# Patient Record
Sex: Female | Born: 1955 | Race: White | Hispanic: No | Marital: Married | Smoking: Former smoker
Health system: Southern US, Community
[De-identification: ages and names within clinical notes are randomized; demographics above are authoritative.]

## PROBLEM LIST (undated history)

## (undated) DIAGNOSIS — M1990 Unspecified osteoarthritis, unspecified site: Secondary | ICD-10-CM

## (undated) DIAGNOSIS — K219 Gastro-esophageal reflux disease without esophagitis: Secondary | ICD-10-CM

## (undated) DIAGNOSIS — R112 Nausea with vomiting, unspecified: Secondary | ICD-10-CM

## (undated) HISTORY — PX: REPLACEMENT TOTAL HIP W/  RESURFACING IMPLANTS: SUR1222

---

## 1998-04-27 ENCOUNTER — Encounter: Payer: Self-pay | Admitting: Surgery

## 1998-04-27 ENCOUNTER — Ambulatory Visit (HOSPITAL_COMMUNITY): Admission: RE | Admit: 1998-04-27 | Discharge: 1998-04-27 | Payer: Self-pay | Admitting: Surgery

## 1998-11-26 ENCOUNTER — Ambulatory Visit (HOSPITAL_COMMUNITY): Admission: RE | Admit: 1998-11-26 | Discharge: 1998-11-26 | Payer: Self-pay | Admitting: Family Medicine

## 1998-11-26 ENCOUNTER — Encounter: Payer: Self-pay | Admitting: Family Medicine

## 1999-09-28 ENCOUNTER — Encounter: Payer: Self-pay | Admitting: Family Medicine

## 1999-09-28 ENCOUNTER — Encounter: Admission: RE | Admit: 1999-09-28 | Discharge: 1999-09-28 | Payer: Self-pay | Admitting: Family Medicine

## 2000-11-01 ENCOUNTER — Ambulatory Visit (HOSPITAL_COMMUNITY): Admission: RE | Admit: 2000-11-01 | Discharge: 2000-11-01 | Payer: Self-pay | Admitting: Family Medicine

## 2000-11-01 ENCOUNTER — Encounter: Payer: Self-pay | Admitting: Family Medicine

## 2001-12-17 ENCOUNTER — Ambulatory Visit (HOSPITAL_COMMUNITY): Admission: RE | Admit: 2001-12-17 | Discharge: 2001-12-17 | Payer: Self-pay | Admitting: Family Medicine

## 2001-12-17 ENCOUNTER — Encounter: Payer: Self-pay | Admitting: Family Medicine

## 2005-01-25 ENCOUNTER — Encounter: Admission: RE | Admit: 2005-01-25 | Discharge: 2005-01-25 | Payer: Self-pay | Admitting: Family Medicine

## 2006-04-11 ENCOUNTER — Encounter: Admission: RE | Admit: 2006-04-11 | Discharge: 2006-04-11 | Payer: Self-pay | Admitting: Family Medicine

## 2007-08-26 ENCOUNTER — Encounter: Admission: RE | Admit: 2007-08-26 | Discharge: 2007-08-26 | Payer: Self-pay | Admitting: Family Medicine

## 2008-04-15 ENCOUNTER — Encounter: Admission: RE | Admit: 2008-04-15 | Discharge: 2008-04-15 | Payer: Self-pay | Admitting: Sports Medicine

## 2008-04-28 ENCOUNTER — Encounter: Admission: RE | Admit: 2008-04-28 | Discharge: 2008-04-28 | Payer: Self-pay | Admitting: Sports Medicine

## 2008-07-09 ENCOUNTER — Encounter: Admission: RE | Admit: 2008-07-09 | Discharge: 2008-07-09 | Payer: Self-pay | Admitting: Orthopedic Surgery

## 2008-09-29 ENCOUNTER — Inpatient Hospital Stay (HOSPITAL_COMMUNITY): Admission: RE | Admit: 2008-09-29 | Discharge: 2008-10-01 | Payer: Self-pay | Admitting: Orthopedic Surgery

## 2008-12-14 ENCOUNTER — Inpatient Hospital Stay (HOSPITAL_COMMUNITY): Admission: RE | Admit: 2008-12-14 | Discharge: 2008-12-16 | Payer: Self-pay | Admitting: Orthopedic Surgery

## 2010-01-18 ENCOUNTER — Encounter
Admission: RE | Admit: 2010-01-18 | Discharge: 2010-01-18 | Payer: Self-pay | Source: Home / Self Care | Attending: Family Medicine | Admitting: Family Medicine

## 2010-05-04 LAB — BASIC METABOLIC PANEL
CO2: 27 mEq/L (ref 19–32)
CO2: 28 mEq/L (ref 19–32)
Calcium: 8.1 mg/dL — ABNORMAL LOW (ref 8.4–10.5)
Calcium: 9.2 mg/dL (ref 8.4–10.5)
Chloride: 105 mEq/L (ref 96–112)
Chloride: 107 mEq/L (ref 96–112)
Creatinine, Ser: 0.62 mg/dL (ref 0.4–1.2)
Creatinine, Ser: 0.65 mg/dL (ref 0.4–1.2)
GFR calc Af Amer: >60 mL/min (ref >60)
GFR calc non Af Amer: >60 mL/min (ref >60)
Glucose, Bld: 100 mg/dL — ABNORMAL HIGH (ref 70–99)
Glucose, Bld: 103 mg/dL — ABNORMAL HIGH (ref 70–99)
Sodium: 137 mEq/L (ref 135–145)
Sodium: 139 mEq/L (ref 135–145)
Sodium: 138 mEq/L (ref 135–145)

## 2010-05-04 LAB — CBC
Hemoglobin: 10.3 g/dL — ABNORMAL LOW (ref 12.0–15.0)
Hemoglobin: 9.8 g/dL — ABNORMAL LOW (ref 12.0–15.0)
Hemoglobin: 13.7 g/dL (ref 12.0–15.0)
MCHC: 33.5 g/dL (ref 30.0–36.0)
MCHC: 33.6 g/dL (ref 30.0–36.0)
MCHC: 34.3 g/dL (ref 30.0–36.0)
MCV: 85.1 fL (ref 78.0–100.0)
MCV: 85.8 fL (ref 78.0–100.0)
Platelets: 264 10*3/uL (ref 150–400)
RBC: 3.59 MIL/uL — ABNORMAL LOW (ref 3.87–5.11)
RDW: 13.3 % (ref 11.5–15.5)
RDW: 13.6 % (ref 11.5–15.5)

## 2010-05-04 LAB — DIFFERENTIAL
Basophils Absolute: 0 10*3/uL (ref 0.0–0.1)
Basophils Relative: 0 % (ref 0–1)
Lymphocytes Relative: 26 % (ref 12–46)
Monocytes Absolute: 0.3 10*3/uL (ref 0.1–1.0)
Neutro Abs: 3.4 10*3/uL (ref 1.7–7.7)
Neutrophils Relative %: 65 % (ref 43–77)

## 2010-05-04 LAB — URINALYSIS, ROUTINE W REFLEX MICROSCOPIC
Bilirubin Urine: NEGATIVE
Ketones, ur: NEGATIVE mg/dL
Nitrite: NEGATIVE
Protein, ur: NEGATIVE mg/dL

## 2010-05-04 LAB — TYPE AND SCREEN
ABO/RH(D): B POS
Antibody Screen: NEGATIVE

## 2010-05-04 LAB — PROTIME-INR: INR: 0.99 (ref 0.00–1.49)

## 2010-05-06 LAB — CBC
HCT: 32 % — ABNORMAL LOW (ref 36.0–46.0)
HCT: 33.3 % — ABNORMAL LOW (ref 36.0–46.0)
Hemoglobin: 11.4 g/dL — ABNORMAL LOW (ref 12.0–15.0)
MCV: 87.2 fL (ref 78.0–100.0)
MCV: 87.3 fL (ref 78.0–100.0)
Platelets: 234 10*3/uL (ref 150–400)
Platelets: 236 10*3/uL (ref 150–400)
RDW: 13.4 % (ref 11.5–15.5)
RDW: 13.6 % (ref 11.5–15.5)
WBC: 6.5 10*3/uL (ref 4.0–10.5)

## 2010-05-06 LAB — BASIC METABOLIC PANEL
BUN: 3 mg/dL — ABNORMAL LOW (ref 6–23)
BUN: 3 mg/dL — ABNORMAL LOW (ref 6–23)
Chloride: 102 mEq/L (ref 96–112)
Chloride: 104 mEq/L (ref 96–112)
Creatinine, Ser: 0.64 mg/dL (ref 0.4–1.2)
GFR calc non Af Amer: >60 mL/min (ref >60)
Glucose, Bld: 104 mg/dL — ABNORMAL HIGH (ref 70–99)
Glucose, Bld: 123 mg/dL — ABNORMAL HIGH (ref 70–99)
Potassium: 3.7 mEq/L (ref 3.5–5.1)
Potassium: 4.1 mEq/L (ref 3.5–5.1)
Sodium: 134 mEq/L — ABNORMAL LOW (ref 135–145)

## 2010-05-07 LAB — URINALYSIS, ROUTINE W REFLEX MICROSCOPIC
Bilirubin Urine: NEGATIVE
Glucose, UA: NEGATIVE mg/dL
Hgb urine dipstick: NEGATIVE
Ketones, ur: NEGATIVE mg/dL
Protein, ur: NEGATIVE mg/dL
Urobilinogen, UA: 0.2 mg/dL (ref 0.0–1.0)

## 2010-05-07 LAB — ABO/RH: ABO/RH(D): B POS

## 2010-05-07 LAB — BASIC METABOLIC PANEL
BUN: 6 mg/dL (ref 6–23)
CO2: 28 mEq/L (ref 19–32)
Calcium: 9.1 mg/dL (ref 8.4–10.5)
Chloride: 102 mEq/L (ref 96–112)
Creatinine, Ser: 0.57 mg/dL (ref 0.4–1.2)
Glucose, Bld: 86 mg/dL (ref 70–99)

## 2010-05-07 LAB — PREGNANCY, URINE: Preg Test, Ur: NEGATIVE

## 2010-05-07 LAB — CBC
MCHC: 34.2 g/dL (ref 30.0–36.0)
MCV: 86.8 fL (ref 78.0–100.0)
RDW: 13.5 % (ref 11.5–15.5)

## 2010-05-07 LAB — TYPE AND SCREEN

## 2010-05-07 LAB — APTT: aPTT: 24 seconds (ref 24–37)

## 2010-05-07 LAB — DIFFERENTIAL
Basophils Absolute: 0 10*3/uL (ref 0.0–0.1)
Basophils Relative: 1 % (ref 0–1)
Eosinophils Absolute: 0.1 10*3/uL (ref 0.0–0.7)
Monocytes Relative: 6 % (ref 3–12)
Neutro Abs: 3.8 10*3/uL (ref 1.7–7.7)
Neutrophils Relative %: 71 % (ref 43–77)

## 2010-05-07 LAB — PROTIME-INR: INR: 1 (ref 0.00–1.49)

## 2010-06-14 NOTE — Discharge Summary (Signed)
Isabel Salazar, Isabel Salazar                ACCOUNT NO.:  [id number]   MEDICAL RECORD NO.:  [id number]          PATIENT TYPE:  INP   LOCATION:  1615                         FACILITY:  [facility]   PHYSICIAN:  Madlyn Frankel. Charlann Boxer, M.D.  DATE OF BIRTH:  04-14-1955   DATE OF ADMISSION:  09/29/2008  DATE OF DISCHARGE:  10/01/2008                               DISCHARGE SUMMARY   ADMITTING DIAGNOSIS:  1. Avascular necrosis.  2. Osteoarthritis.   DISCHARGE DIAGNOSIS:  1. Avascular necrosis.  2. Osteoarthritis.   HISTORY OF PRESENT ILLNESS:  A 55 year old female with a history of left  hip pain secondary to avascular necrosis.  It was refractory to all  conservative treatment.   CONSULTANT:  None.   PROCEDURE:  Left total hip replacement by surgeon Dr. Durene Romans,  assistant Dwyane Luo PA-C.  Components included Pinnacle metal insert 36  with a Pinnacle sector cup, size #5 Trilock, and metal 36+ 1.5 head.   LABORATORY DATA:  CBC final readings, white blood cells 6.1, hematocrit  32.  Metabolic:  Sodium 139, potassium 4.1, BUN 3, creatinine 0.64,  glucose 104.   HOSPITAL COURSE:  The patient was admitted to the hospital, underwent a  left total hip replacement, admitted to the orthopedic floor.  Her stay  was unremarkable.  She remained hemodynamically and orthopedically  stable throughout the course stay.  She remained afebrile.  Day #2  dressing check, no significant drainage from wound.  She is  neurovascularly intact to this left lower extremity.  She was  weightbearing as tolerated.  Making good progress.  She was stable and  ready for discharge home.   DISCHARGE DISPOSITION:  Discharged home in stable improved condition.   DISCHARGE DIET:  Heart-healthy.   DISCHARGE WOUND CARE:  Keep dry.   DISCHARGE MEDICATIONS:  1. Aspirin 325 mg p.o. b.i.d. times 6 weeks  2. Robaxin 500 mg one p.o. q. six p.r.n. muscle spasm pain.  3. Iron 325 mg one p.o. t.i.d. x2 weeks.  4. Colace 100 mg p.o.  b.i.d.  5. MiraLax 17 grams p.o. daily p.r.n.  6. Norco 7.5/325 one to two p.o. q. 4 to 6 hours p.r.n. pain.   DISCHARGE FOLLOWUP:  1. Follow with Dr. Charlann Boxer at phone number [phone] in two weeks for      wound check.  2. Physical therapy.  She is weightbearing as tolerated with use of      rolling walker.     ______________________________  Yetta Glassman [address], [place]      Madlyn Frankel. Charlann Boxer, M.D.  Electronically Signed    BLM/MEDQ  D:  10/01/2008  T:  10/01/2008  Job:  [id number]   cc:   Dorisann Frames  Fax: [phone]

## 2010-06-14 NOTE — Op Note (Signed)
Isabel Salazar, Isabel Salazar                ACCOUNT NO.:  [id number]   MEDICAL RECORD NO.:  [id number]          PATIENT TYPE:  INP   LOCATION:  0004                         FACILITY:  [facility]   PHYSICIAN:  Madlyn Frankel. Charlann Boxer, M.D.  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   PROCEDURE:  Left total hip replacement.  Components used:  DePuy hip  system, size 50 pinnacle cup, 36 metal liner, a 5 high Tri-Lock steam  with a 36, 1.5 aSphere ball.   SURGEON:  Madlyn Frankel. Charlann Boxer, M.D.   ASSISTANT:  Dwyane Luo, PA-C   ANESTHESIA:  General.   SPECIMENS AND FINDINGS:  None.   COMPLICATIONS:  None.   BLOOD LOSS:  400 mL.   DRAINS:  One Hemovac.   INDICATION FOR PROCEDURE:  Ms. Helin is a 55 year old female who  presented for a second opinion evaluation of some lower extremity pain.  She had her back and knees worked up prior; however, complaints were  more significant around the hip joint.  Radiographs revealed that she  had end-stage bilateral hip osteoarthritis secondary to an underlying  developmental dysplasia of her hip.  She had a Crowe type 1 with  containment of the femoral head but significant lateralization within  the acetabulum and advanced bone-on-bone arthritis.  At this point there  were no conservative measures that would provide relief and despite  being 53, the only option of pain relief that was predictable enough  would be arthroplasty.  The risks of infection, DVT, component failure,  need for revision as well as discussion of bearing surfaces include  prior to her consenting for total hip replacement for pain relief.   PROCEDURE IN DETAIL:  The patient was brought to operative theater.  Once adequate  anesthesia, preoperative antibiotics, Ancef, was  administered, the patient was positioned in the right lateral decubitus  position with the  left side up.  The left lower extremity was  prescrubbed, prepped and draped in sterile fashion.  A time-out was  performed, identifying the patient, the planned procedure and the  correct extremity.   A lateral-based incision was made proximal to the trochanter.  Sharp  dissection was carried down to the iliotibial band and gluteal fascia.  The short external rotators were taken down separate from the posterior  capsule.  An L capsulotomy was made, preserving the posterior capsule.  The hip was dislocated and a neck osteotomy was made based off anatomic  landmarks.  Preoperative templating utilizing the high-offset neck and  head sizes and based on the center of the head.  Femoral canal was then  opened with a box osteotome, then drilled and hand-reamed once, followed  by irrigation to prevent fat emboli, beginning to broach and setting  anteversion to her native anteversion, which was probably about 20  degrees plus.  I broached up to a size 4, used a calcar planer to finish  off the cut.  I then packed off the femur was went to the acetabulum.  When acetabular was obtained, the labrum was debrided and we began  reaming with a 44, reamed up to a 51 reamer with good bony bed  preparation.  I used a 5/8 curved osteotome to remove posterior and  anterior inferior osteophytes to further define the anatomy of the  pelvis.  The final 52 pinnacle cup was then impacted.  It sat at  approximately 35-40 degrees of abduction and was in anatomic position  within the pelvis with a portion of the cup exposed superolaterally and  then into the anterior rim anteriorly.  With the cup positioned, there  appeared to be about 35-40 degrees of abduction.  Two cancellous screws  were placed and the ilium with support and purchase.   Based on the cup position and my  broaching, I went ahead and placed a  final liner.  A hole eliminator was placed and a 36 metal liner was then  impacted into place.   At this point the trial reduction ensued with a 4 four broach and a high-  offset neck.  With this,hip, combined anteversion was 45-50 degrees and  the hip was very stable without evidence of impingement or subluxation  with forward flexion, internal rotation or external rotation in  extension.   There was a little bit of shuck in extension.  Given this and the fact  that her contralateral hip was just as bad, I chose to go ahead and  broach up to a size 5, so I went ahead and removed the trials, broached  up to a 5, which sat about 2 mm proud of my neck cut.  The final 5 high  Tri-Lock stem was then impacted, sat about 2-3 mm higher than the neck  cut.  I retrialed and was happy with a 36, 1.5 ball in terms of range of  motion and stability.  The final 36, +1.5 aSphere ball was then impacted  on a clean and dry trunnion.  The hip was reduced.  The hip was  irrigated throughout the case and again at this point.  I reapproximated  the posterior capsule and the superior leaflet using #1 Vicryl.  The  medium Hemovac drain was placed deep.  The iliotibial band and gluteal  fascia were then reapproximated using #1 Vicryl, 2-0 Vicryl the in subcu  layer and a running 4-0 Monocryl in the skin.  Skin was cleaned, dried  and dressed sterilely with Steri-Strips and a Mepilex dressing.  She was  then brought to the recovery room and extubated in stable condition,  tolerating the procedure well.      Madlyn Frankel Charlann Boxer, M.D.  Electronically Signed     MDO/MEDQ  D:  09/29/2008  T:  09/29/2008  Job:  [id number]

## 2010-06-17 NOTE — H&P (Signed)
NAMESARAHBETH, CASHIN                ACCOUNT NO.:  [id number]   MEDICAL RECORD NO.:  [id number]          PATIENT TYPE:  INP   LOCATION:  NA                           FACILITY:  [facility]   PHYSICIAN:  Madlyn Frankel. Charlann Boxer, M.D.  DATE OF BIRTH:  Jun 01, 1955   DATE OF ADMISSION:  09/29/2008  DATE OF DISCHARGE:                              HISTORY & PHYSICAL   PROCEDURE:  Will be a left total hip placement.   CHIEF COMPLAINT:  Left hip pain.   HISTORY OF PRESENT ILLNESS:  A 55 year old female with a history of left  hip pain secondary to avascular necrosis.  It has been refractory to all  conservative treatment.  She has also is mildly symptomatic on the right  side as well.   PRIMARY CARE PHYSICIAN:  Dr. Creta Levin.   PAST MEDICAL HISTORY:  1. Avascular necrosis.  2. Osteoarthritis.   PAST SURGERIES:  Microfracture left knee April 2009.   FAMILY HISTORY:  Cancer, lymphoma osteoarthritis, COPD.   SOCIAL HISTORY:  Married.  She is a Lawyer.  Nonsmoker.  Primary caregiver  will be husband home postoperatively.   DRUG ALLERGIES:  No known drug allergies.   MEDICATIONS:  1. Tramadol 1 to 2 p.o. q.6 p.r.n.  2. Darvocet-N 100 one to two p.o. q.4-6 p.r.n. pain.   REVIEW OF SYSTEMS:  None other than HPI.   PHYSICAL EXAMINATION:  VITAL SIGNS:  Pulse 72, respirations 16, blood  pressure 128/80.  GENERAL:  Awake, alert and oriented, well-developed, well-nourished.  HEENT:  Normocephalic.  NECK:  Supple.  No carotid bruits.  CHEST:  Lungs clear to auscultation bilaterally.  BREASTS:  Deferred.  HEART:  S1, S2 distinct.  ABDOMEN:  Soft, nontender, bowel sounds present.  GENITOURINARY:  Deferred.  EXTREMITIES:  Left hip has increased pain with weightbearing.  Uses a  cane.  SKIN:  No cellulitis.  NEUROLOGIC:  Intact distal sensibilities.   LABORATORY DATA:  Labs, EKG, chest x-ray all pending presurgical  testing.   IMPRESSION:  Left hip avascular necrosis.   PLAN OF ACTION:  Left total hip  replacement, Dr. Charlann Boxer at [facility]  September 29, 2008.  Risks and complications were discussed.   Postoperative medications were provided including aspirin for DVT  prophylaxis.     ______________________________  Yetta Glassman [address], [place]      Madlyn Frankel. Charlann Boxer, M.D.  Electronically Signed    BLM/MEDQ  D:  09/14/2008  T:  09/14/2008  Job:  [id number]   cc:   Dorisann Frames  Fax: [phone]

## 2012-02-07 ENCOUNTER — Other Ambulatory Visit: Payer: Self-pay | Admitting: Family Medicine

## 2012-02-07 DIAGNOSIS — Z1231 Encounter for screening mammogram for malignant neoplasm of breast: Secondary | ICD-10-CM

## 2012-02-27 ENCOUNTER — Ambulatory Visit: Payer: Self-pay

## 2012-04-02 ENCOUNTER — Ambulatory Visit: Payer: Self-pay

## 2012-05-02 ENCOUNTER — Ambulatory Visit: Payer: Self-pay

## 2012-05-28 ENCOUNTER — Ambulatory Visit
Admission: RE | Admit: 2012-05-28 | Discharge: 2012-05-28 | Disposition: A | Discharge status: Home / Self Care | Payer: 59 | Source: Ambulatory Visit | Attending: Family Medicine | Admitting: Family Medicine

## 2012-05-28 DIAGNOSIS — Z1231 Encounter for screening mammogram for malignant neoplasm of breast: Secondary | ICD-10-CM

## 2013-07-17 ENCOUNTER — Other Ambulatory Visit: Payer: Self-pay

## 2013-07-17 DIAGNOSIS — Z1231 Encounter for screening mammogram for malignant neoplasm of breast: Secondary | ICD-10-CM

## 2013-07-24 ENCOUNTER — Ambulatory Visit: Payer: 59

## 2013-08-04 ENCOUNTER — Ambulatory Visit
Admission: RE | Admit: 2013-08-04 | Discharge: 2013-08-04 | Disposition: A | Discharge status: Home / Self Care | Payer: 59 | Source: Ambulatory Visit

## 2013-08-04 DIAGNOSIS — Z1231 Encounter for screening mammogram for malignant neoplasm of breast: Secondary | ICD-10-CM

## 2014-08-20 ENCOUNTER — Other Ambulatory Visit: Payer: Self-pay | Admitting: Orthopedic Surgery

## 2014-08-20 DIAGNOSIS — M5442 Lumbago with sciatica, left side: Secondary | ICD-10-CM

## 2014-08-21 ENCOUNTER — Other Ambulatory Visit: Payer: Self-pay | Admitting: Orthopedic Surgery

## 2014-08-21 ENCOUNTER — Inpatient Hospital Stay
Admission: RE | Admit: 2014-08-21 | Discharge: 2014-08-21 | Disposition: A | Discharge status: Home / Self Care | Payer: Self-pay | Source: Ambulatory Visit | Attending: Orthopedic Surgery | Admitting: Orthopedic Surgery

## 2014-08-21 ENCOUNTER — Ambulatory Visit
Admission: RE | Admit: 2014-08-21 | Discharge: 2014-08-21 | Disposition: A | Discharge status: Home / Self Care | Payer: 59 | Source: Ambulatory Visit | Attending: Orthopedic Surgery | Admitting: Orthopedic Surgery

## 2014-08-21 DIAGNOSIS — M5442 Lumbago with sciatica, left side: Secondary | ICD-10-CM

## 2014-08-21 MED ORDER — DIAZEPAM 5 MG PO TABS
10.0000 mg | ORAL_TABLET | Freq: Once | ORAL | Status: AC
  Administered 2014-08-21: 10 mg via ORAL

## 2014-08-21 MED ORDER — IOHEXOL 180 MG/ML  SOLN
20.0000 mL | Freq: Once | INTRAMUSCULAR | Status: AC | PRN
  Administered 2014-08-21: 20 mL via INTRATHECAL

## 2014-08-21 NOTE — Progress Notes (Signed)
Patient states he last dose of Tramadol was two days ago.  jkl

## 2014-08-21 NOTE — Discharge Instructions (Addendum)
Myelogram Discharge Instructions  1. Go home and rest quietly for the next 24 hours.  It is important to lie flat for the next 24 hours.  Get up only to go to the restroom.  You may lie in the bed or on a couch on your back, your stomach, your left side or your right side.  You may have one pillow under your head.  You may have pillows between your knees while you are on your side or under your knees while you are on your back.  2. DO NOT drive today.  Recline the seat as far back as it will go, while still wearing your seat belt, on the way home.  3. You may get up to go to the bathroom as needed.  You may sit up for 10 minutes to eat.  You may resume your normal diet and medications unless otherwise indicated.  Drink plenty of extra fluids today and tomorrow.  4. The incidence of a spinal headache with nausea and/or vomiting is about 5% (one in 20 patients).  If you develop a headache, lie flat and drink plenty of fluids until the headache goes away.  Caffeinated beverages may be helpful.  If you develop severe nausea and vomiting or a headache that does not go away with flat bed rest, call [phone].  5. You may resume normal activities after your 24 hours of bed rest is over; however, do not exert yourself strongly or do any heavy lifting tomorrow.  6. Call your physician for a follow-up appointment.   You may resume Tramadol on Saturday, August 22, 2014 after 11:30a.m.

## 2014-10-15 ENCOUNTER — Other Ambulatory Visit (HOSPITAL_COMMUNITY): Payer: Self-pay | Admitting: Family Medicine

## 2014-10-15 DIAGNOSIS — Z1231 Encounter for screening mammogram for malignant neoplasm of breast: Secondary | ICD-10-CM

## 2014-10-19 ENCOUNTER — Ambulatory Visit (HOSPITAL_COMMUNITY)
Admission: RE | Admit: 2014-10-19 | Discharge: 2014-10-19 | Disposition: A | Discharge status: Home / Self Care | Payer: 59 | Source: Ambulatory Visit | Attending: Family Medicine | Admitting: Family Medicine

## 2014-10-19 DIAGNOSIS — Z1231 Encounter for screening mammogram for malignant neoplasm of breast: Secondary | ICD-10-CM | POA: Diagnosis not present

## 2015-11-25 ENCOUNTER — Emergency Department (HOSPITAL_COMMUNITY)
Admission: EM | Admit: 2015-11-25 | Discharge: 2015-11-26 | Disposition: A | Discharge status: Home / Self Care | Payer: 59 | Attending: Emergency Medicine | Admitting: Emergency Medicine

## 2015-11-25 ENCOUNTER — Emergency Department (HOSPITAL_COMMUNITY): Payer: 59

## 2015-11-25 ENCOUNTER — Encounter (HOSPITAL_COMMUNITY): Payer: Self-pay

## 2015-11-25 DIAGNOSIS — Z96643 Presence of artificial hip joint, bilateral: Secondary | ICD-10-CM | POA: Insufficient documentation

## 2015-11-25 DIAGNOSIS — R112 Nausea with vomiting, unspecified: Secondary | ICD-10-CM

## 2015-11-25 DIAGNOSIS — R42 Dizziness and giddiness: Secondary | ICD-10-CM | POA: Insufficient documentation

## 2015-11-25 DIAGNOSIS — Z87891 Personal history of nicotine dependence: Secondary | ICD-10-CM | POA: Insufficient documentation

## 2015-11-25 LAB — CBC WITH DIFFERENTIAL/PLATELET
BASOS ABS: 0 10*3/uL (ref 0.0–0.1)
BASOS PCT: 0 %
Eosinophils Absolute: 0 10*3/uL (ref 0.0–0.7)
Eosinophils Relative: 0 %
HEMATOCRIT: 42.6 % (ref 36.0–46.0)
HEMOGLOBIN: 15.1 g/dL — AB (ref 12.0–15.0)
LYMPHS PCT: 9 %
Lymphs Abs: 0.6 10*3/uL — ABNORMAL LOW (ref 0.7–4.0)
MCH: 29.3 pg (ref 26.0–34.0)
MCHC: 35.4 g/dL (ref 30.0–36.0)
MCV: 82.6 fL (ref 78.0–100.0)
MONO ABS: 0.3 10*3/uL (ref 0.1–1.0)
Monocytes Relative: 4 %
NEUTROS ABS: 6.3 10*3/uL (ref 1.7–7.7)
NEUTROS PCT: 87 %
Platelets: 235 10*3/uL (ref 150–400)
RBC: 5.16 MIL/uL — AB (ref 3.87–5.11)
RDW: 12.7 % (ref 11.5–15.5)
WBC: 7.2 10*3/uL (ref 4.0–10.5)

## 2015-11-25 LAB — COMPREHENSIVE METABOLIC PANEL
ALBUMIN: 4.1 g/dL (ref 3.5–5.0)
ALK PHOS: 52 U/L (ref 38–126)
ALT: 25 U/L (ref 14–54)
ANION GAP: 10 (ref 5–15)
AST: 20 U/L (ref 15–41)
BILIRUBIN TOTAL: 0.9 mg/dL (ref 0.3–1.2)
BUN: 12 mg/dL (ref 6–20)
CALCIUM: 9.2 mg/dL (ref 8.9–10.3)
CO2: 23 mmol/L (ref 22–32)
Chloride: 103 mmol/L (ref 101–111)
Creatinine, Ser: 0.84 mg/dL (ref 0.44–1.00)
GFR calc non Af Amer: >60 mL/min (ref >60)
GLUCOSE: 106 mg/dL — AB (ref 65–99)
POTASSIUM: 3.7 mmol/L (ref 3.5–5.1)
SODIUM: 136 mmol/L (ref 135–145)
TOTAL PROTEIN: 6.2 g/dL — AB (ref 6.5–8.1)

## 2015-11-25 MED ORDER — MECLIZINE HCL 25 MG PO TABS
25.0000 mg | ORAL_TABLET | Freq: Once | ORAL | Status: AC
  Administered 2015-11-25: 25 mg via ORAL
  Filled 2015-11-25: qty 1

## 2015-11-25 MED ORDER — CLONAZEPAM 0.125 MG PO TBDP
0.5000 mg | ORAL_TABLET | Freq: Once | ORAL | Status: AC
  Administered 2015-11-25: 0.5 mg via ORAL
  Filled 2015-11-25: qty 4

## 2015-11-25 MED ORDER — LORAZEPAM 2 MG/ML IJ SOLN
1.0000 mg | Freq: Once | INTRAMUSCULAR | Status: AC
  Administered 2015-11-25: 1 mg via INTRAVENOUS
  Filled 2015-11-25: qty 1

## 2015-11-25 MED ORDER — TRAMADOL HCL 50 MG PO TABS
50.0000 mg | ORAL_TABLET | Freq: Once | ORAL | Status: AC
  Administered 2015-11-25: 50 mg via ORAL
  Filled 2015-11-25: qty 1

## 2015-11-25 NOTE — ED Triage Notes (Addendum)
Pt arrives EMS with c/o n/v over 0930. Also c/o dizziness and spinning with eyes open. Pt had injury to L4/5 and c/o back pain. Currently getting therapy for back pain. EMS gave 8 mg zofran PTA

## 2015-11-25 NOTE — ED Notes (Signed)
Pt. Returned to room stated that the "medication helped her."

## 2015-11-25 NOTE — ED Notes (Signed)
Pt assisted to standing position. Denies dizziness, has mild nausea, pt believes this may be due to lack of food and fluids. After lying flat in the bed, pt began to have dizziness. VSS. Reports this episode is milder than when initially arriving to ED

## 2015-11-25 NOTE — ED Notes (Signed)
MD at bedside. 

## 2015-11-25 NOTE — ED Notes (Signed)
Patient transported to MRI 

## 2015-11-25 NOTE — ED Provider Notes (Signed)
[facility]-EMERGENCY DEPT Provider Note   CSN: [id number] Arrival date & time: 11/25/15  1504     History   Chief Complaint Chief Complaint  Patient presents with  . Dizziness  . Nausea  . Emesis    HPI Isabel Salazar is a 60 y.o. female.  60 year old female with history of hip replacements, low back pain who presents with dizziness. This morning around 9:30 AM, the patient was laying on the couch when she had a sudden onset of dizziness described as room spinning sensation. It has been associated with nausea and vomiting. Her symptoms have an persistent since that time and she has noticed that any movement and especially sitting up make it much worse. She has never had this before. She has developed a mild headache from the vomiting but no sudden onset of severe headache. No visual changes or extremity numbness/weakness. She has had ongoing low back pain problems for the past 5-6 weeks for which she has been managed by orthopedics. She denies any fevers or recent illness. She had some mild tinnitus yesterday but denies any hearing problems currently. No cough/cold symptoms. No neck pain. Daughter does note that she has been dropping things from hands more frequently recently and patient is not sure why. She thought it may be related to pain medication use from her back pain. No FH of stroke.   The history is provided by the patient.  Dizziness  Associated symptoms: vomiting   Emesis      History reviewed. No pertinent past medical history.  There are no active problems to display for this patient.   Past Surgical History:  Procedure Laterality Date  . REPLACEMENT TOTAL HIP W/  RESURFACING IMPLANTS Bilateral     OB History    No data available       Home Medications    Prior to Admission medications   Medication Sig Start Date End Date Taking? Authorizing Provider  LORazepam (ATIVAN) 1 MG tablet Take 0.5 mg by mouth every evening.   Yes Historical Provider, MD    oxyCODONE-acetaminophen (PERCOCET/ROXICET) 5-325 MG tablet Take 1 tablet by mouth every 4 (four) hours as needed for severe pain.   Yes Historical Provider, MD  traMADol (ULTRAM) 50 MG tablet Take 50 mg by mouth every 6 (six) hours as needed.   Yes Historical Provider, MD  clonazePAM (KLONOPIN) 0.5 MG tablet Take 1 tablet (0.5 mg total) by mouth 2 (two) times daily as needed (dizziness). 11/26/15   Laurence Spates, MD  meclizine (ANTIVERT) 25 MG tablet Take 1 tablet (25 mg total) by mouth 3 (three) times daily as needed for dizziness. 11/26/15   Laurence Spates, MD    Family History No family history on file.  Social History Social History  Substance Use Topics  . Smoking status: Former Games developer  . Smokeless tobacco: Never Used  . Alcohol use No     Allergies   Review of patient's allergies indicates no known allergies.   Review of Systems Review of Systems  Gastrointestinal: Positive for vomiting.  Neurological: Positive for dizziness.   10 Systems reviewed and are negative for acute change except as noted in the HPI.   Physical Exam Updated Vital Signs BP 101/67   Pulse 71   Temp 98.3 F (36.8 C) (Oral)   Resp 16   Ht 5\' 5"  (1.651 m)   Wt 178 lb (80.7 kg)   SpO2 95%   BMI 29.62 kg/m   Physical Exam  Constitutional: She  is oriented to person, place, and time. She appears well-developed and well-nourished. No distress.  Uncomfortable, eyes closed but Awake, alert Laying prone  HENT:  Head: Normocephalic and atraumatic.  Right Ear: Tympanic membrane and ear canal normal.  Left Ear: Tympanic membrane and ear canal normal.  Eyes: Conjunctivae and EOM are normal. Pupils are equal, round, and reactive to light.  Neck: Neck supple.  Cardiovascular: Normal rate, regular rhythm and normal heart sounds.   No murmur heard. Pulmonary/Chest: Effort normal and breath sounds normal. No respiratory distress.  Abdominal: Soft. Bowel sounds are normal. She exhibits no  distension. There is no tenderness.  Musculoskeletal: She exhibits no edema or tenderness.  No midline spinal tenderness  Neurological: She is alert and oriented to person, place, and time. She has normal reflexes. No cranial nerve deficit. She exhibits normal muscle tone.  Fluent speech, normal finger-to-nose testing, negative pronator drift, no clonus 5/5 strength and normal sensation x all 4 extremities  Skin: Skin is warm and dry. No rash noted. No erythema.  Psychiatric: She has a normal mood and affect. Judgment and thought content normal.  Nursing note and vitals reviewed.    ED Treatments / Results  Labs (all labs ordered are listed, but only abnormal results are displayed) Labs Reviewed  COMPREHENSIVE METABOLIC PANEL - Abnormal; Notable for the following:       Result Value   Glucose, Bld 106 (*)    Total Protein 6.2 (*)    All other components within normal limits  CBC WITH DIFFERENTIAL/PLATELET - Abnormal; Notable for the following:    RBC 5.16 (*)    Hemoglobin 15.1 (*)    Lymphs Abs 0.6 (*)    All other components within normal limits    EKG  EKG Interpretation None       Radiology Mr Brain Wo Contrast (neuro Protocol)  Result Date: 11/25/2015 CLINICAL DATA:  60 y/o F; sudden onset vertigo wall lying down and recent problems dropping things with hands. EXAM: MRI HEAD WITHOUT CONTRAST TECHNIQUE: Multiplanar, multiecho pulse sequences of the brain and surrounding structures were obtained without intravenous contrast. COMPARISON:  None. FINDINGS: Brain: No acute infarction, hemorrhage, hydrocephalus, extra-axial collection or mass lesion. Punctate focus of susceptibility hypointensity within the left superior frontal gyrus probably represents hemosiderin deposition from old microhemorrhage. Vascular: Normal flow voids. Skull and upper cervical spine: Normal marrow signal. Sinuses/Orbits: Right mastoid effusion with areas of T1 shortening. No abnormal signal of the  paranasal sinuses or left mastoid air cells. Orbits are unremarkable. Other: None. IMPRESSION: 1. Right mastoid effusion with with proteinaceous debris. 2. Unremarkable MRI of the brain for age. Electronically Signed   By: Mitzi Hansen M.D.   On: 11/25/2015 18:09    Procedures Procedures (including critical care time)  Medications Ordered in ED Medications  LORazepam (ATIVAN) injection 1 mg (1 mg Intravenous Given 11/25/15 1627)  clonazepam (KLONOPIN) disintegrating tablet 0.5 mg (0.5 mg Oral Given 11/25/15 2000)  traMADol (ULTRAM) tablet 50 mg (50 mg Oral Given 11/25/15 1959)  meclizine (ANTIVERT) tablet 25 mg (25 mg Oral Given 11/25/15 2344)     Initial Impression / Assessment and Plan / ED Course  I have reviewed the triage vital signs and the nursing notes.  Pertinent labs & imaging results that were available during my care of the patient were reviewed by me and considered in my medical decision making (see chart for details).  Clinical Course   PT w/ sudden onset of His anus which she describes  as room spinning sensation, worse with any movement or sitting up and associated with vomiting. She was uncomfortable, laying prone in the bed, but with normal vital signs at presentation. She had a normal neurologic exam. Her symptoms have been persistent rather than episodic and I'm concerned about the report of occasional decreased grip strength without complaint of neck pain. Obtained MRI of the brain to evaluate for central cause of vertigo. Gave the patient Ativan and obtained basic screening labs. Later gave Klonopin.  MRI shows right mastoid effusion but no evidence of infection. Otherwise unremarkable. Labwork reassuring. After receiving Klonopin, the patient was able to sit up and ambulate, tolerated juice and crackers without vomiting. Her dizziness had improved. I discussed follow-up with PCP regarding the vertigo symptoms and provided with meclizine and Clonopin,  explained that she may use one or the other but not both at the same time as needed for vertigo symptoms. Instructed to take Sudafed for the next few days for her mastoid effusion. Reviewed return precautions and patient discharged in satisfactory condition.  Final Clinical Impressions(s) / ED Diagnoses   Final diagnoses:  Vertigo  Non-intractable vomiting with nausea, unspecified vomiting type    New Prescriptions New Prescriptions   CLONAZEPAM (KLONOPIN) 0.5 MG TABLET    Take 1 tablet (0.5 mg total) by mouth 2 (two) times daily as needed (dizziness).   MECLIZINE (ANTIVERT) 25 MG TABLET    Take 1 tablet (25 mg total) by mouth 3 (three) times daily as needed for dizziness.     Laurence Spatesachel Morgan Shadee Montoya, MD 11/26/15 [phone]

## 2015-11-25 NOTE — ED Notes (Signed)
Pt asleep, family reports pt had one episode of vomiting following medication administration and continued to have dizziness. Dr.little aware

## 2015-11-25 NOTE — ED Notes (Addendum)
Pt. Reports lying on the couch with room spinning. Pt reports being on the phone with her daughter and "things went black" Pt. Denies LOC. Pt. Reports movement "mkaes her start spinning and then she gets sick."

## 2015-11-25 NOTE — ED Notes (Signed)
Phleb aware unable to obtain labs from IV. Phleb aware of need to draw labs.

## 2015-11-26 MED ORDER — MECLIZINE HCL 25 MG PO TABS: 25.0000 mg | ORAL_TABLET | Freq: Three times a day (TID) | ORAL | 0 refills | Status: DC | PRN

## 2015-11-26 MED ORDER — CLONAZEPAM 0.5 MG PO TABS: 0.5000 mg | ORAL_TABLET | Freq: Two times a day (BID) | ORAL | 0 refills | Status: DC | PRN

## 2015-11-26 NOTE — Discharge Instructions (Signed)
You may try meclizine or clonazepam for vertigo symptoms but do not take these medicines together at the same time as they may make you too sleepy. Do not drive while taking these medications and do not take at the same time as any pain medication except Tylenol or ibuprofen. Take Sudafed on a schedule for 2-3 days for your ear effusion.

## 2015-11-26 NOTE — ED Notes (Signed)
Pt ambulatory to restroom with steady gait, denies dizziness or nausea at present

## 2015-12-08 ENCOUNTER — Other Ambulatory Visit: Payer: Self-pay | Admitting: Family Medicine

## 2015-12-08 DIAGNOSIS — Z1231 Encounter for screening mammogram for malignant neoplasm of breast: Secondary | ICD-10-CM

## 2015-12-27 ENCOUNTER — Ambulatory Visit
Admission: RE | Admit: 2015-12-27 | Discharge: 2015-12-27 | Disposition: A | Discharge status: Home / Self Care | Payer: 59 | Source: Ambulatory Visit | Attending: Family Medicine | Admitting: Family Medicine

## 2015-12-27 DIAGNOSIS — Z1231 Encounter for screening mammogram for malignant neoplasm of breast: Secondary | ICD-10-CM

## 2016-12-04 ENCOUNTER — Other Ambulatory Visit: Payer: Self-pay | Admitting: Family Medicine

## 2016-12-04 DIAGNOSIS — Z1231 Encounter for screening mammogram for malignant neoplasm of breast: Secondary | ICD-10-CM

## 2016-12-29 ENCOUNTER — Ambulatory Visit
Admission: RE | Admit: 2016-12-29 | Discharge: 2016-12-29 | Disposition: A | Discharge status: Home / Self Care | Payer: 59 | Source: Ambulatory Visit | Attending: Family Medicine | Admitting: Family Medicine

## 2016-12-29 DIAGNOSIS — Z1231 Encounter for screening mammogram for malignant neoplasm of breast: Secondary | ICD-10-CM

## 2018-02-28 ENCOUNTER — Other Ambulatory Visit: Payer: Self-pay | Admitting: Family Medicine

## 2018-02-28 DIAGNOSIS — Z1231 Encounter for screening mammogram for malignant neoplasm of breast: Secondary | ICD-10-CM

## 2018-03-27 ENCOUNTER — Ambulatory Visit
Admission: RE | Admit: 2018-03-27 | Discharge: 2018-03-27 | Disposition: A | Discharge status: Home / Self Care | Payer: Managed Care, Other (non HMO) | Source: Ambulatory Visit | Attending: Family Medicine | Admitting: Family Medicine

## 2018-03-27 DIAGNOSIS — Z1231 Encounter for screening mammogram for malignant neoplasm of breast: Secondary | ICD-10-CM

## 2019-10-09 ENCOUNTER — Other Ambulatory Visit: Payer: Managed Care, Other (non HMO)

## 2019-11-17 ENCOUNTER — Other Ambulatory Visit: Payer: Self-pay | Admitting: Family Medicine

## 2019-11-17 DIAGNOSIS — Z1231 Encounter for screening mammogram for malignant neoplasm of breast: Secondary | ICD-10-CM

## 2019-11-20 ENCOUNTER — Ambulatory Visit
Admission: RE | Admit: 2019-11-20 | Discharge: 2019-11-20 | Disposition: A | Discharge status: Home / Self Care | Payer: Managed Care, Other (non HMO) | Source: Ambulatory Visit | Attending: Family Medicine | Admitting: Family Medicine

## 2019-11-20 ENCOUNTER — Other Ambulatory Visit: Payer: Self-pay

## 2019-11-20 DIAGNOSIS — Z1231 Encounter for screening mammogram for malignant neoplasm of breast: Secondary | ICD-10-CM

## 2020-10-14 ENCOUNTER — Other Ambulatory Visit: Payer: Self-pay | Admitting: Family Medicine

## 2020-10-14 DIAGNOSIS — E2839 Other primary ovarian failure: Secondary | ICD-10-CM

## 2020-11-11 ENCOUNTER — Other Ambulatory Visit: Payer: Managed Care, Other (non HMO)

## 2020-11-11 ENCOUNTER — Other Ambulatory Visit: Payer: Self-pay | Admitting: Family Medicine

## 2020-11-11 DIAGNOSIS — Z1231 Encounter for screening mammogram for malignant neoplasm of breast: Secondary | ICD-10-CM

## 2020-11-16 ENCOUNTER — Ambulatory Visit
Admission: RE | Admit: 2020-11-16 | Discharge: 2020-11-16 | Disposition: A | Discharge status: Home / Self Care | Payer: Managed Care, Other (non HMO) | Source: Ambulatory Visit | Attending: Family Medicine | Admitting: Family Medicine

## 2020-11-16 ENCOUNTER — Other Ambulatory Visit: Payer: Self-pay

## 2020-11-16 DIAGNOSIS — E2839 Other primary ovarian failure: Secondary | ICD-10-CM

## 2020-12-09 ENCOUNTER — Ambulatory Visit: Payer: Managed Care, Other (non HMO)

## 2021-01-11 ENCOUNTER — Ambulatory Visit: Payer: Managed Care, Other (non HMO)

## 2021-02-07 ENCOUNTER — Ambulatory Visit
Admission: RE | Admit: 2021-02-07 | Discharge: 2021-02-07 | Disposition: A | Discharge status: Home / Self Care | Payer: Managed Care, Other (non HMO) | Source: Ambulatory Visit | Attending: Family Medicine | Admitting: Family Medicine

## 2021-02-07 DIAGNOSIS — Z1231 Encounter for screening mammogram for malignant neoplasm of breast: Secondary | ICD-10-CM

## 2022-01-27 ENCOUNTER — Other Ambulatory Visit: Payer: Self-pay | Admitting: Family Medicine

## 2022-01-27 DIAGNOSIS — Z1231 Encounter for screening mammogram for malignant neoplasm of breast: Secondary | ICD-10-CM

## 2022-03-17 ENCOUNTER — Ambulatory Visit
Admission: RE | Admit: 2022-03-17 | Discharge: 2022-03-17 | Disposition: A | Discharge status: Home / Self Care | Payer: Medicare Other | Source: Ambulatory Visit | Attending: Family Medicine | Admitting: Family Medicine

## 2022-03-17 DIAGNOSIS — Z1231 Encounter for screening mammogram for malignant neoplasm of breast: Secondary | ICD-10-CM

## 2023-03-14 ENCOUNTER — Other Ambulatory Visit: Payer: Self-pay | Admitting: Family Medicine

## 2023-03-14 DIAGNOSIS — Z1231 Encounter for screening mammogram for malignant neoplasm of breast: Secondary | ICD-10-CM

## 2023-03-22 ENCOUNTER — Ambulatory Visit: Payer: Medicare Other

## 2023-04-04 ENCOUNTER — Ambulatory Visit
Admission: RE | Admit: 2023-04-04 | Discharge: 2023-04-04 | Disposition: A | Discharge status: Home / Self Care | Payer: Medicare Other | Source: Ambulatory Visit | Attending: Family Medicine | Admitting: Family Medicine

## 2023-04-04 DIAGNOSIS — Z1231 Encounter for screening mammogram for malignant neoplasm of breast: Secondary | ICD-10-CM

## 2023-04-26 IMAGING — MG MM DIGITAL SCREENING BILAT W/ TOMO AND CAD
8 series · 8 of 24 positions shown · non-contrast
Comparison: Previous exam(s).

CLINICAL DATA: Screening.

EXAM:
DIGITAL SCREENING BILATERAL MAMMOGRAM WITH TOMOSYNTHESIS AND CAD
TECHNIQUE: Bilateral screening digital craniocaudal and mediolateral oblique
mammograms were obtained. Bilateral screening digital breast
tomosynthesis was performed. The images were evaluated with
computer-aided detection.

[R MLO synth-2D]
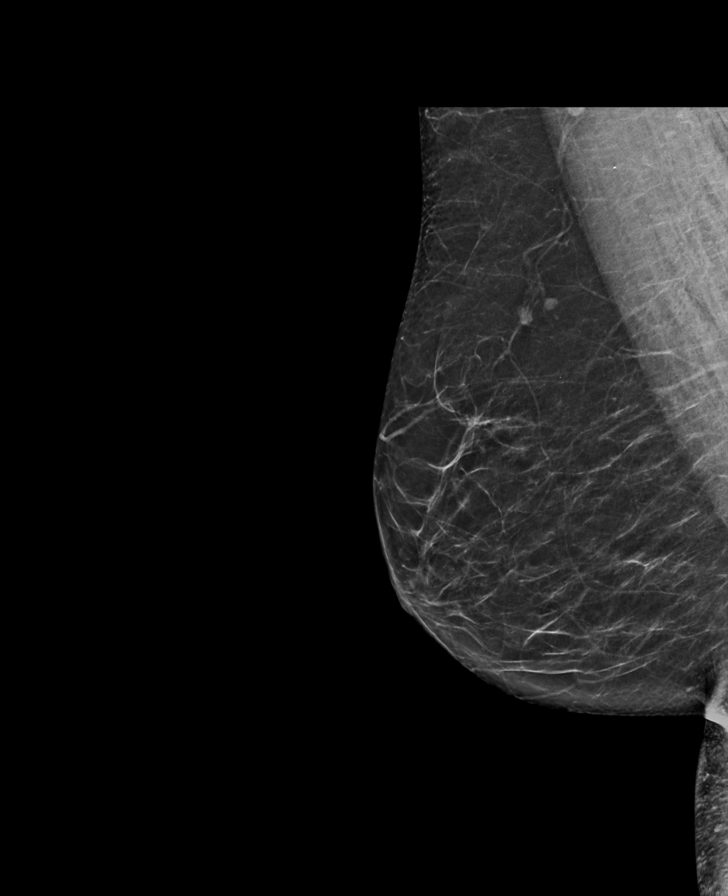

[R CC synth-2D]
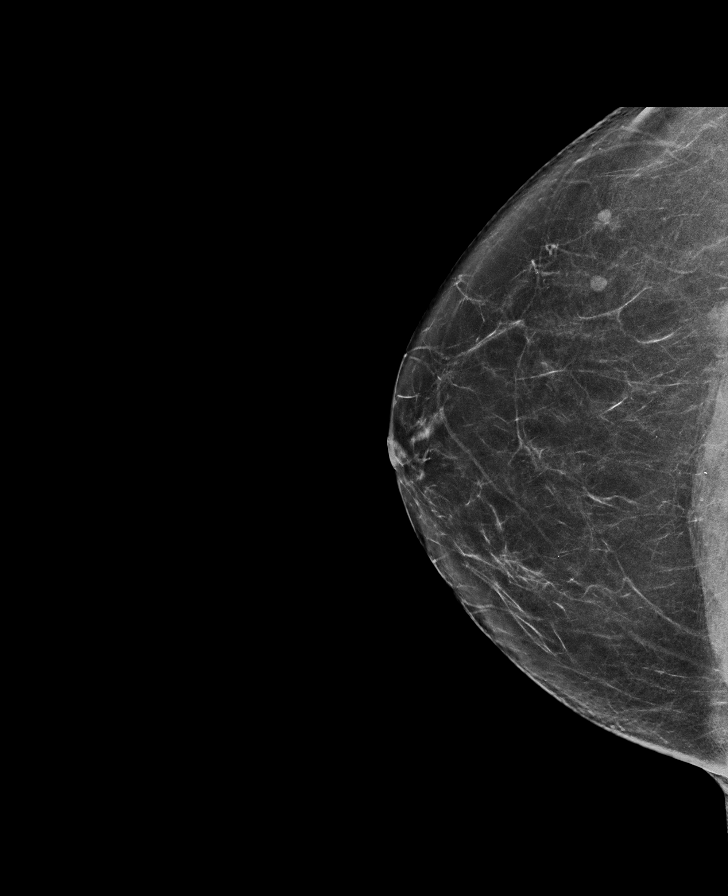

[L CC synth-2D]
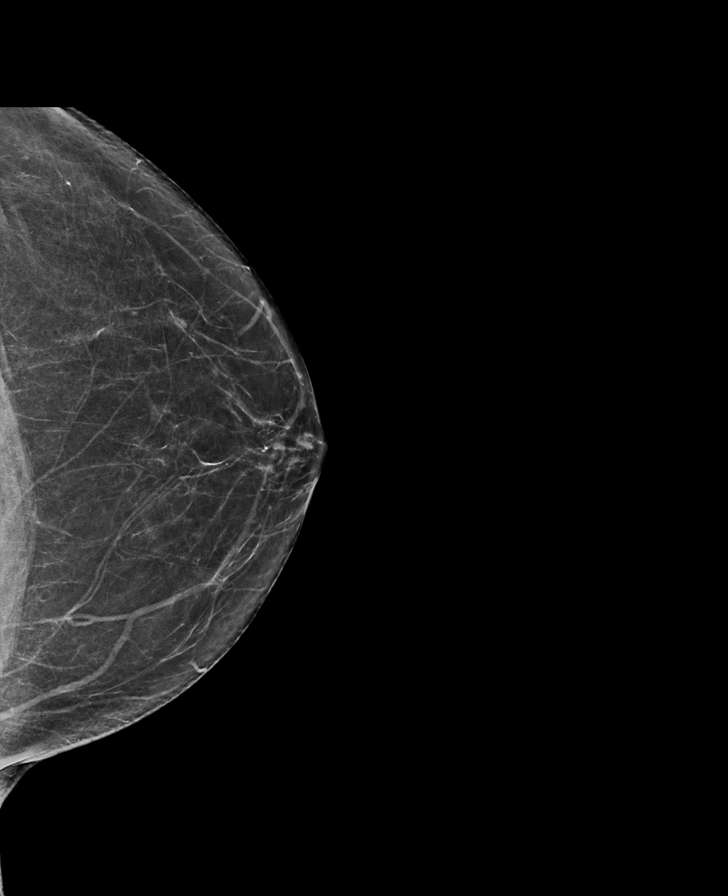

[L MLO synth-2D]
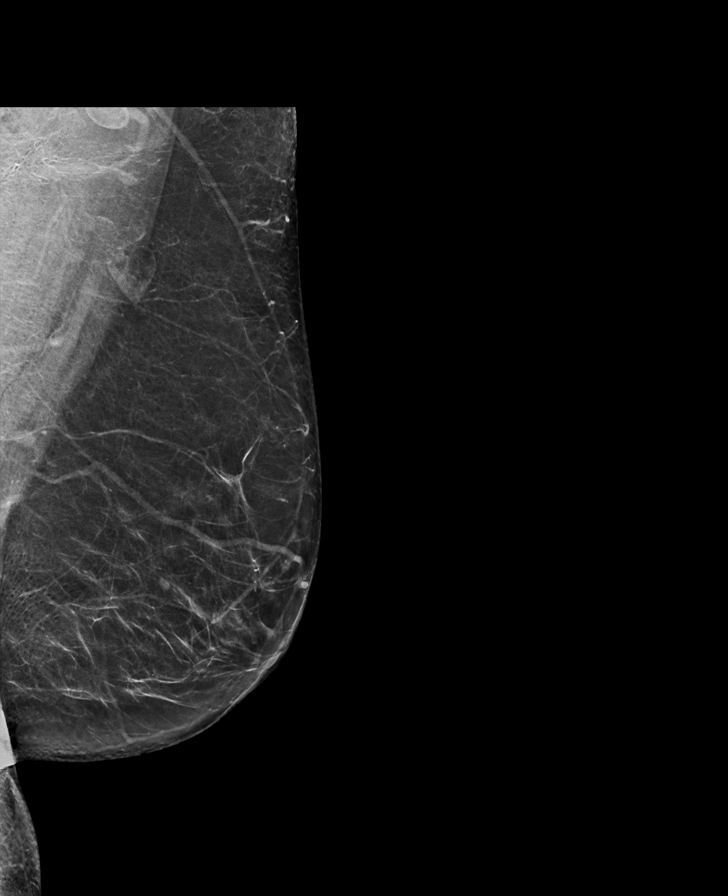

[L MLO tomo · tomo slice 36/71.0]
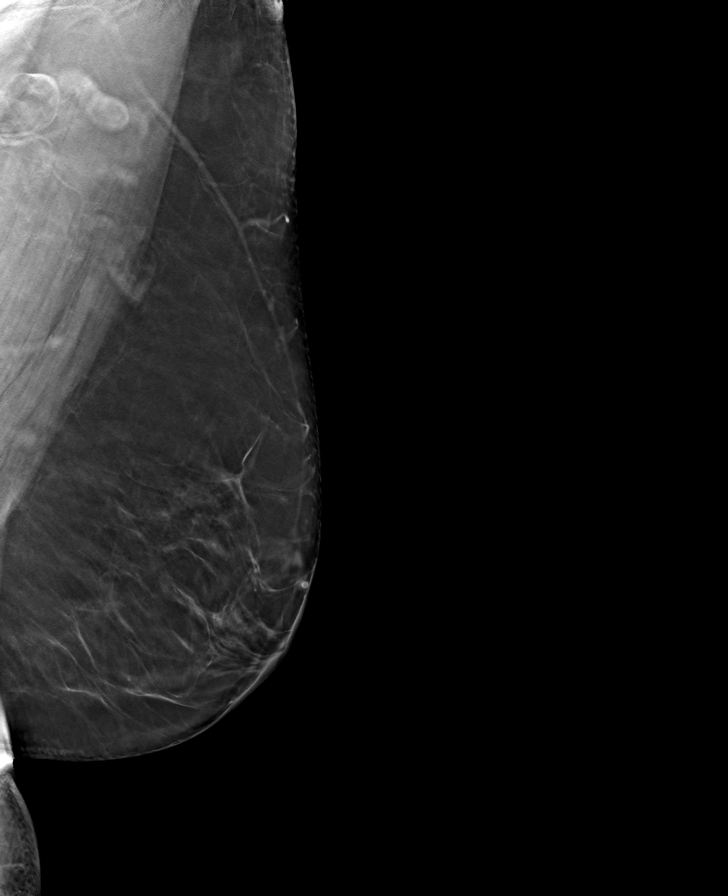

[L CC tomo · tomo slice 33/64.0]
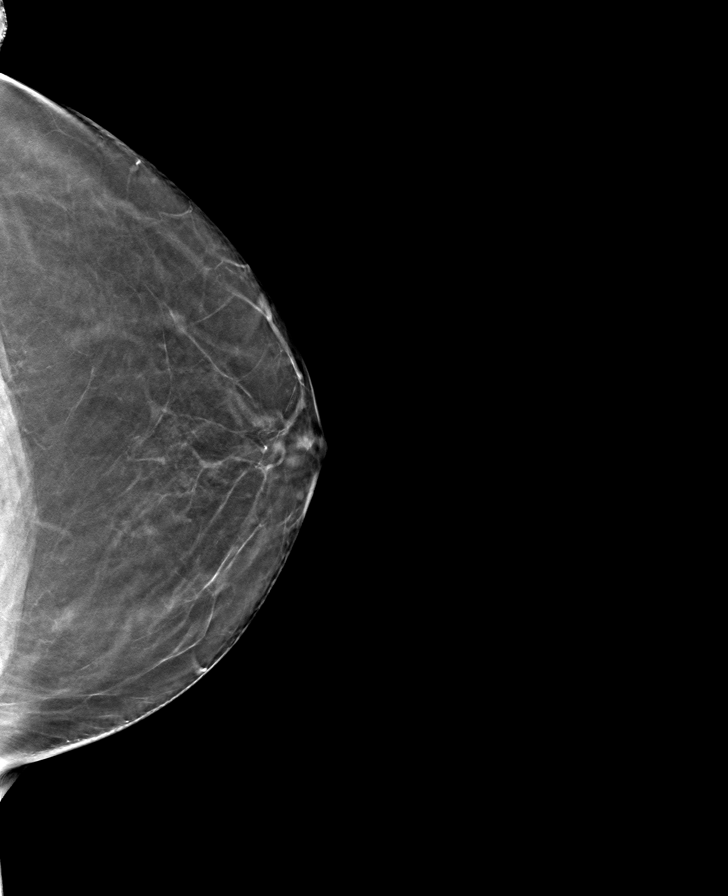

[R MLO tomo · tomo slice 32/63.0]
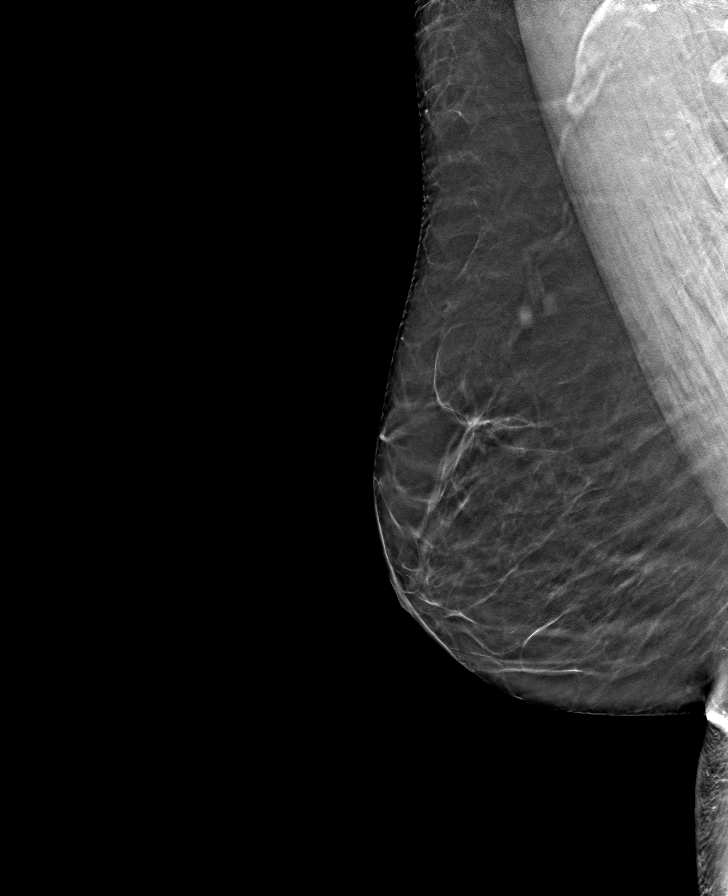

[R CC tomo · tomo slice 35/68.0]
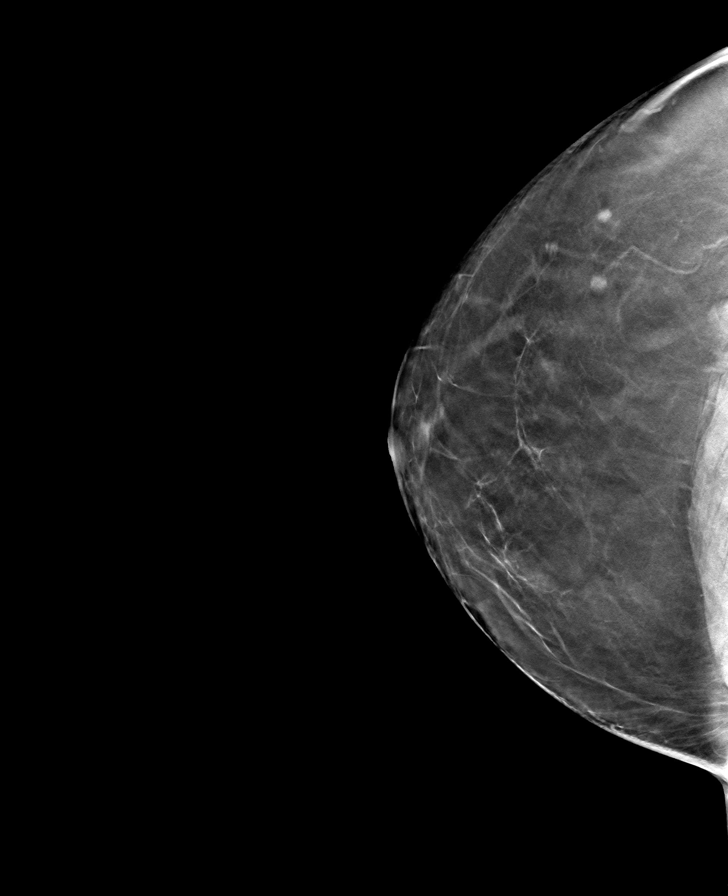

[8 of 24 positions shown; findings below may reference images not displayed]

ACR Breast Density Category b: There are scattered areas of
fibroglandular density.
FINDINGS: There are no findings suspicious for malignancy.
IMPRESSION: No mammographic evidence of malignancy. A result letter of this
screening mammogram will be mailed directly to the patient.

RECOMMENDATION:
Screening mammogram in one year. (Code:51-O-LD2)

BI-RADS CATEGORY  1: Negative.

## 2024-01-04 NOTE — Patient Instructions (Signed)
 SURGICAL WAITING ROOM VISITATION  Patients having surgery or a procedure may have no more than 2 support people in the waiting area - these visitors may rotate.    Children under the age of 33 must have an adult with them who is not the patient.  Visitors with respiratory illnesses are discouraged from visiting and should remain at home.  If the patient needs to stay at the hospital during part of their recovery, the visitor guidelines for inpatient rooms apply. Pre-op nurse will coordinate an appropriate time for 1 support person to accompany patient in pre-op.  This support person may not rotate.    Please refer to the [facility] website for the visitor guidelines for Inpatients (after your surgery is over and you are in a regular room).       Your procedure is scheduled on:   01/17/2024    Report to [facility] Main Entrance    Report to admitting at  0515 AM   Call this number if you have problems the morning of surgery [phone]   Do not eat food :After Midnight.   After Midnight you may have the following liquids until _ 0430_____ AM  DAY OF SURGERY  Water Non-Citrus Juices (without pulp, NO RED-Apple, White grape, White cranberry) Black Coffee (NO MILK/CREAM OR CREAMERS, sugar ok)  Clear Tea (NO MILK/CREAM OR CREAMERS, sugar ok) regular and decaf                             Plain Jell-O (NO RED)                                           Fruit ices (not with fruit pulp, NO RED)                                     Popsicles (NO RED)                                                               Sports drinks like Gatorade (NO RED)                     The day of surgery:  Drink ONE (1) Pre-Surgery Clear Ensure or G2 at 0430 AM the morning of surgery. Drink in one sitting. Do not sip.  This drink was given to you during your hospital  pre-op appointment visit. Nothing else to drink after completing the  Pre-Surgery Clear Ensure or G2.          If you have  questions, please contact your surgeon's office.      Oral Hygiene is also important to reduce your risk of infection.                                    Remember - BRUSH YOUR TEETH THE MORNING OF SURGERY WITH YOUR REGULAR TOOTHPASTE  DENTURES WILL BE REMOVED PRIOR TO SURGERY PLEASE DO NOT APPLY Poly grip  OR ADHESIVES!!!   Do NOT smoke after Midnight   Stop all vitamins and herbal supplements 7 days before surgery.   Take these medicines the morning of surgery with A SIP OF WATER:  eye drops as usual , flonase if needed +  DO NOT TAKE ANY ORAL DIABETIC MEDICATIONS DAY OF YOUR SURGERY  Bring CPAP mask and tubing day of surgery.                              You may not have any metal on your body including hair pins, jewelry, and body piercing             Do not wear make-up, lotions, powders, perfumes/cologne, or deodorant  Do not wear nail polish including gel and S&S, artificial/acrylic nails, or any other type of covering on natural nails including finger and toenails. If you have artificial nails, gel coating, etc. that needs to be removed by a nail salon please have this removed prior to surgery or surgery may need to be canceled/ delayed if the surgeon/ anesthesia feels like they are unable to be safely monitored.   Do not shave  48 hours prior to surgery.               Men may shave face and neck.   Do not bring valuables to the hospital. [facility] IS NOT             RESPONSIBLE   FOR VALUABLES.   Contacts, glasses, dentures or bridgework may not be worn into surgery.   Bring small overnight bag day of surgery.   DO NOT BRING YOUR HOME MEDICATIONS TO THE HOSPITAL. PHARMACY WILL DISPENSE MEDICATIONS LISTED ON YOUR MEDICATION LIST TO YOU DURING YOUR ADMISSION IN THE HOSPITAL!    Patients discharged on the day of surgery will not be allowed to drive home.  Someone NEEDS to stay with you for the first 24 hours after anesthesia.   Special Instructions: Bring a copy of  your healthcare power of attorney and living will documents the day of surgery if you haven't scanned them before.              Please read over the following fact sheets you were given: IF YOU HAVE QUESTIONS ABOUT YOUR PRE-OP INSTRUCTIONS PLEASE CALL [phone].   If you received a COVID test during your pre-op visit  it is requested that you wear a mask when out in public, stay away from anyone that may not be feeling well and notify your surgeon if you develop symptoms. If you test positive for Covid or have been in contact with anyone that has tested positive in the last 10 days please notify you surgeon.      Pre-operative 4 CHG Bath Instructions   You can play a key role in reducing the risk of infection after surgery. Your skin needs to be as free of germs as possible. You can reduce the number of germs on your skin by washing with CHG (chlorhexidine gluconate) soap before surgery. CHG is an antiseptic soap that kills germs and continues to kill germs even after washing.   DO NOT use if you have an allergy to chlorhexidine/CHG or antibacterial soaps. If your skin becomes reddened or irritated, stop using the CHG and notify one of our RNs at [phone].   Please shower with the CHG soap starting 4 days before surgery using the following schedule:  Please keep in mind the following:  DO NOT shave, including legs and underarms, starting the day of your first shower.   You may shave your face at any point before/day of surgery.  Place clean sheets on your bed the day you start using CHG soap. Use a clean washcloth (not used since being washed) for each shower. DO NOT sleep with pets once you start using the CHG.   CHG Shower Instructions:  If you choose to wash your hair and private area, wash first with your normal shampoo/soap.  After you use shampoo/soap, rinse your hair and body thoroughly to remove shampoo/soap residue.  Turn the water OFF and apply about 3 tablespoons (45 ml)  of CHG soap to a CLEAN washcloth.  Apply CHG soap ONLY FROM YOUR NECK DOWN TO YOUR TOES (washing for 3-5 minutes)  DO NOT use CHG soap on face, private areas, open wounds, or sores.  Pay special attention to the area where your surgery is being performed.  If you are having back surgery, having someone wash your back for you may be helpful. Wait 2 minutes after CHG soap is applied, then you may rinse off the CHG soap.  Pat dry with a clean towel  Put on clean clothes/pajamas   If you choose to wear lotion, please use ONLY the CHG-compatible lotions on the back of this paper.     Additional instructions for the day of surgery: DO NOT APPLY any lotions, deodorants, cologne, or perfumes.   Put on clean/comfortable clothes.  Brush your teeth.  Ask your nurse before applying any prescription medications to the skin.      CHG Compatible Lotions   Aveeno Moisturizing lotion  Cetaphil Moisturizing Cream  Cetaphil Moisturizing Lotion  Clairol Herbal Essence Moisturizing Lotion, Dry Skin  Clairol Herbal Essence Moisturizing Lotion, Extra Dry Skin  Clairol Herbal Essence Moisturizing Lotion, Normal Skin  Curel Age Defying Therapeutic Moisturizing Lotion with Alpha Hydroxy  Curel Extreme Care Body Lotion  Curel Soothing Hands Moisturizing Hand Lotion  Curel Therapeutic Moisturizing Cream, Fragrance-Free  Curel Therapeutic Moisturizing Lotion, Fragrance-Free  Curel Therapeutic Moisturizing Lotion, Original Formula  Eucerin Daily Replenishing Lotion  Eucerin Dry Skin Therapy Plus Alpha Hydroxy Crme  Eucerin Dry Skin Therapy Plus Alpha Hydroxy Lotion  Eucerin Original Crme  Eucerin Original Lotion  Eucerin Plus Crme Eucerin Plus Lotion  Eucerin TriLipid Replenishing Lotion  Keri Anti-Bacterial Hand Lotion  Keri Deep Conditioning Original Lotion Dry Skin Formula Softly Scented  Keri Deep Conditioning Original Lotion, Fragrance Free Sensitive Skin Formula  Keri Lotion Fast Absorbing  Fragrance Free Sensitive Skin Formula  Keri Lotion Fast Absorbing Softly Scented Dry Skin Formula  Keri Original Lotion  Keri Skin Renewal Lotion Keri Silky Smooth Lotion  Keri Silky Smooth Sensitive Skin Lotion  Nivea Body Creamy Conditioning Oil  Nivea Body Extra Enriched Teacher, Adult Education Moisturizing Lotion Nivea Crme  Nivea Skin Firming Lotion  NutraDerm 30 Skin Lotion  NutraDerm Skin Lotion  NutraDerm Therapeutic Skin Cream  NutraDerm Therapeutic Skin Lotion  ProShield Protective Hand Cream  Provon moisturizing lotion

## 2024-01-04 NOTE — Progress Notes (Signed)
 Anesthesia Review:  PCP: Cardiologist :  PPM/ ICD: Device Orders: Rep Notified:  Chest x-ray : EKG : Echo : Stress test: Cardiac Cath :   Activity level:  Sleep Study/ CPAP : Fasting Blood Sugar :      / Checks Blood Sugar -- times a day:    Blood Thinner/ Instructions /Last Dose: ASA / Instructions/ Last Dose :

## 2024-01-07 ENCOUNTER — Encounter (HOSPITAL_COMMUNITY): Payer: Self-pay

## 2024-01-07 ENCOUNTER — Encounter (HOSPITAL_COMMUNITY)
Admission: RE | Admit: 2024-01-07 | Discharge: 2024-01-07 | Disposition: A | Source: Ambulatory Visit | Attending: Orthopedic Surgery

## 2024-01-07 ENCOUNTER — Other Ambulatory Visit: Payer: Self-pay

## 2024-01-07 VITALS — BP 129/86 | HR 58 | Temp 98.1°F | Resp 16 | Ht 65.0 in | Wt 177.0 lb

## 2024-01-07 DIAGNOSIS — Z01818 Encounter for other preprocedural examination: Secondary | ICD-10-CM

## 2024-01-07 HISTORY — DX: Nausea with vomiting, unspecified: R11.2

## 2024-01-07 HISTORY — DX: Unspecified osteoarthritis, unspecified site: M19.90

## 2024-01-07 HISTORY — DX: Gastro-esophageal reflux disease without esophagitis: K21.9

## 2024-01-07 LAB — SURGICAL PCR SCREEN
MRSA, PCR: NEGATIVE
Staphylococcus aureus: NEGATIVE

## 2024-01-07 LAB — CBC
HCT: 43.2 % (ref 36.0–46.0)
Hemoglobin: 14.5 g/dL (ref 12.0–15.0)
MCH: 29.8 pg (ref 26.0–34.0)
MCHC: 33.6 g/dL (ref 30.0–36.0)
MCV: 88.7 fL (ref 80.0–100.0)
Platelets: 269 K/uL (ref 150–400)
RBC: 4.87 MIL/uL (ref 3.87–5.11)
RDW: 13.2 % (ref 11.5–15.5)
WBC: 4 K/uL (ref 4.0–10.5)
nRBC: 0 % (ref 0.0–0.2)

## 2024-01-11 NOTE — H&P (Signed)
 TOTAL KNEE ADMISSION H&P  Patient is being admitted for right total knee arthroplasty.  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  HPI: 47, 68 y.o. female, has a history of pain and functional disability in the right knee due to arthritis and has failed non-surgical conservative treatments for greater than 12 weeks to includeNSAID's and/or analgesics, corticosteriod injections, and activity modification.  Onset of symptoms was gradual, starting 2 years ago with gradually worsening course since that time. The patient noted no past surgery on the right knee(s).  Patient currently rates pain in the right knee(s) at 8 out of 10 with activity. Patient has worsening of pain with activity and weight bearing and pain that interferes with activities of daily living.  Patient has evidence of joint space narrowing by imaging studies. There is no active infection.  There are no active problems to display for this patient.  Past Medical History:  Diagnosis Date   Arthritis    GERD (gastroesophageal reflux disease)    PONV (postoperative nausea and vomiting)     Past Surgical History:  Procedure Laterality Date   REPLACEMENT TOTAL HIP W/  RESURFACING IMPLANTS Bilateral     No current facility-administered medications for this encounter.   Current Outpatient Medications  Medication Sig Dispense Refill Last Dose/Taking   acetaminophen (TYLENOL) 500 MG tablet Take 1,000 mg by mouth in the morning.   Taking   dorzolamide-timolol  (COSOPT) 2-0.5 % ophthalmic solution Place 1 drop into the right eye 2 (two) times daily.   Taking   fluticasone (FLONASE) 50 MCG/ACT nasal spray Place 1 spray into both nostrils daily as needed for allergies or rhinitis.   Taking As Needed   latanoprost (XALATAN) 0.005 % ophthalmic solution Place 1 drop into both eyes at bedtime.   Taking   LORazepam  (ATIVAN ) 1 MG tablet Take 1 mg by mouth at bedtime.   Taking   Nutritional Supplements (ESTROVEN PO) Take 1 tablet by mouth in the morning.   Taking   rosuvastatin (CRESTOR) 10 MG tablet Take 10 mg by mouth at bedtime.   Taking   Allergies[1]  Social History   Tobacco Use   Smoking status: Former   Smokeless tobacco: Never  Substance Use Topics   Alcohol use: No    Family History  Problem Relation Age of Onset   Breast cancer Neg Hx      Review of Systems  Constitutional:  Negative for chills and fever.  Respiratory:  Negative for cough and shortness of breath.   Cardiovascular:  Negative for chest pain.  Gastrointestinal:  Negative for nausea and vomiting.  Musculoskeletal:  Positive for arthralgias.     Objective:  Physical Exam Right knee exam: No palpable effusions, warmth erythema No significant flexion contracture with flexion to 120 degrees with tenderness over the medial anterior aspect the knee Stable medial and lateral collateral ligaments  Vital signs in last 24 hours:    Labs:   Estimated body mass index is 29.45 kg/m as calculated from the following:   Height as of 01/07/24: 5' 5 (  1.651 m).   Weight as of 01/07/24: 80.3 kg.   Imaging Review Plain radiographs demonstrate severe degenerative joint disease of the right knee(s). The overall alignment isneutral. The bone quality appears to be adequate for age and reported activity level.      Assessment/Plan:  End stage arthritis, right knee   The patient history, physical examination, clinical judgment of the provider and imaging studies are  consistent with end stage degenerative joint disease of the right knee(s) and total knee arthroplasty is deemed medically necessary. The treatment options including medical management, injection therapy arthroscopy and arthroplasty were discussed at length. The risks and benefits of total knee arthroplasty were presented and reviewed. The risks due to aseptic loosening, infection, stiffness, patella tracking problems, thromboembolic complications and other imponderables were discussed. The patient acknowledged the explanation, agreed to proceed with the plan and consent was signed. Patient is being admitted for inpatient treatment for surgery, pain control, PT, OT, prophylactic antibiotics, VTE prophylaxis, progressive ambulation and ADL's and discharge planning. The patient is planning to be discharged home.     Patient's anticipated LOS is less than 2 midnights, meeting these requirements: - Younger than 94 - Lives within 1 hour of care - Has a competent adult at home to recover with post-op recover - NO history of  - Chronic pain requiring opiods  - Diabetes  - Coronary Artery Disease  - Heart failure  - Heart attack  - Stroke  - DVT/VTE  - Cardiac arrhythmia  - Respiratory Failure/COPD  - Renal failure  - Anemia  - Advanced Liver disease  Rosina Calin, PA-C Orthopedic Surgery [facility] [place] [phone]      [1] No Known Allergies

## 2024-01-16 NOTE — Anesthesia Preprocedure Evaluation (Signed)
 Anesthesia Evaluation  Patient identified by MRN, date of birth, ID band Patient awake    Reviewed: Allergy & Precautions, NPO status , Patient's Chart, lab work & pertinent test results  History of Anesthesia Complications (+) PONV and history of anesthetic complications  Airway Mallampati: I  TM Distance: >3 FB Neck ROM: Full    Dental  (+) Teeth Intact, Dental Advisory Given   Pulmonary former smoker   breath sounds clear to auscultation       Cardiovascular negative cardio ROS  Rhythm:Regular Rate:Normal     Neuro/Psych negative neurological ROS  negative psych ROS   GI/Hepatic Neg liver ROS,GERD  ,,  Endo/Other  negative endocrine ROS    Renal/GU negative Renal ROS     Musculoskeletal  (+) Arthritis ,    Abdominal   Peds  Hematology negative hematology ROS (+)   Anesthesia Other Findings   Reproductive/Obstetrics                              Anesthesia Physical Anesthesia Plan  ASA: 2  Anesthesia Plan: Spinal   Post-op Pain Management: Regional block*   Induction: Intravenous  PONV Risk Score and Plan: 4 or greater and Ondansetron , Dexamethasone , Propofol  infusion and Midazolam   Airway Management Planned: Natural Airway and Simple Face Mask  Additional Equipment: None  Intra-op Plan:   Post-operative Plan:   Informed Consent: I have reviewed the patients History and Physical, chart, labs and discussed the procedure including the risks, benefits and alternatives for the proposed anesthesia with the patient or authorized representative who has indicated his/her understanding and acceptance.     Dental advisory given  Plan Discussed with: CRNA  Anesthesia Plan Comments: (Lab Results      Component                Value               Date                      WBC                      4.0                 01/07/2024                HGB                      14.5                 01/07/2024                HCT                      43.2                01/07/2024                MCV                      88.7                01/07/2024                PLT  269                 01/07/2024           )         Anesthesia Quick Evaluation

## 2024-01-17 ENCOUNTER — Ambulatory Visit (HOSPITAL_COMMUNITY): Admitting: Anesthesiology

## 2024-01-17 ENCOUNTER — Encounter (HOSPITAL_COMMUNITY): Admission: RE | Payer: Self-pay | Source: Ambulatory Visit

## 2024-01-17 ENCOUNTER — Observation Stay (HOSPITAL_COMMUNITY)
Admission: RE | Admit: 2024-01-17 | Discharge: 2024-01-18 | Disposition: A | Discharge status: Home / Self Care | Source: Ambulatory Visit | Attending: Orthopedic Surgery | Admitting: Orthopedic Surgery

## 2024-01-17 ENCOUNTER — Encounter (HOSPITAL_COMMUNITY): Payer: Self-pay | Admitting: Orthopedic Surgery

## 2024-01-17 ENCOUNTER — Other Ambulatory Visit: Payer: Self-pay

## 2024-01-17 ENCOUNTER — Encounter (HOSPITAL_COMMUNITY): Payer: Self-pay | Admitting: Medical

## 2024-01-17 DIAGNOSIS — Z87891 Personal history of nicotine dependence: Secondary | ICD-10-CM | POA: Insufficient documentation

## 2024-01-17 DIAGNOSIS — M1711 Unilateral primary osteoarthritis, right knee: Principal | ICD-10-CM | POA: Insufficient documentation

## 2024-01-17 DIAGNOSIS — Z96651 Presence of right artificial knee joint: Principal | ICD-10-CM

## 2024-01-17 DIAGNOSIS — M25561 Pain in right knee: Secondary | ICD-10-CM | POA: Diagnosis present

## 2024-01-17 DIAGNOSIS — Z01818 Encounter for other preprocedural examination: Secondary | ICD-10-CM

## 2024-01-17 HISTORY — PX: TOTAL KNEE ARTHROPLASTY: SHX125

## 2024-01-17 SURGERY — ARTHROPLASTY, KNEE, TOTAL
Anesthesia: Spinal | Site: Knee | Laterality: Right

## 2024-01-17 MED ORDER — ONDANSETRON HCL 4 MG/2ML IJ SOLN
INTRAMUSCULAR | Status: AC
  Filled 2024-01-17: qty 2

## 2024-01-17 MED ORDER — PHENYLEPHRINE HCL-NACL 20-0.9 MG/250ML-% IV SOLN
INTRAVENOUS | Status: DC | PRN
  Administered 2024-01-17: 07:00:00 50 ug/min via INTRAVENOUS

## 2024-01-17 MED ORDER — PHENYLEPHRINE HCL-NACL 20-0.9 MG/250ML-% IV SOLN
INTRAVENOUS | Status: AC
  Filled 2024-01-17: qty 750

## 2024-01-17 MED ORDER — SODIUM CHLORIDE (PF) 0.9 % IJ SOLN
INTRAMUSCULAR | Status: AC
  Filled 2024-01-17: qty 50

## 2024-01-17 MED ORDER — OXYCODONE HCL 5 MG PO TABS: 5.0000 mg | ORAL_TABLET | Freq: Once | ORAL | Status: DC | PRN

## 2024-01-17 MED ORDER — FENTANYL CITRATE (PF) 100 MCG/2ML IJ SOLN
INTRAMUSCULAR | Status: DC | PRN
  Administered 2024-01-17: 07:00:00 100 ug via INTRAVENOUS

## 2024-01-17 MED ORDER — BUPIVACAINE IN DEXTROSE 0.75-8.25 % IT SOLN
INTRATHECAL | Status: DC | PRN
  Administered 2024-01-17: 07:00:00 1.6 mL via INTRATHECAL

## 2024-01-17 MED ORDER — KETOROLAC TROMETHAMINE 30 MG/ML IJ SOLN
INTRAMUSCULAR | Status: AC
  Filled 2024-01-17: qty 1

## 2024-01-17 MED ORDER — MIDAZOLAM HCL 2 MG/2ML IJ SOLN
INTRAMUSCULAR | Status: AC
  Filled 2024-01-17: qty 2

## 2024-01-17 MED ORDER — KETOROLAC TROMETHAMINE 30 MG/ML IJ SOLN
INTRAMUSCULAR | Status: DC | PRN
  Administered 2024-01-17: 08:00:00 30 mg

## 2024-01-17 MED ORDER — ROPIVACAINE HCL 5 MG/ML IJ SOLN
INTRAMUSCULAR | Status: DC | PRN
  Administered 2024-01-17: 07:00:00 20 mL via PERINEURAL

## 2024-01-17 MED ORDER — TRANEXAMIC ACID-NACL 1000-0.7 MG/100ML-% IV SOLN
1000.0000 mg | INTRAVENOUS | Status: AC
  Administered 2024-01-17: 08:00:00 1000 mg via INTRAVENOUS
  Filled 2024-01-17: qty 100

## 2024-01-17 MED ORDER — FENTANYL CITRATE (PF) 100 MCG/2ML IJ SOLN
INTRAMUSCULAR | Status: AC
  Filled 2024-01-17: qty 2

## 2024-01-17 MED ORDER — CHLORHEXIDINE GLUCONATE 0.12 % MT SOLN
15.0000 mL | Freq: Once | OROMUCOSAL | Status: AC
  Administered 2024-01-17: 06:00:00 15 mL via OROMUCOSAL

## 2024-01-17 MED ORDER — PHENOL 1.4 % MT LIQD: 1.0000 | OROMUCOSAL | Status: DC | PRN

## 2024-01-17 MED ORDER — STERILE WATER FOR IRRIGATION IR SOLN
Status: DC | PRN
  Administered 2024-01-17: 08:00:00 2000 mL

## 2024-01-17 MED ORDER — ROSUVASTATIN CALCIUM 10 MG PO TABS
10.0000 mg | ORAL_TABLET | Freq: Every day | ORAL | Status: DC
  Administered 2024-01-17: 21:00:00 10 mg via ORAL
  Filled 2024-01-17: qty 1

## 2024-01-17 MED ORDER — CELECOXIB 200 MG PO CAPS
200.0000 mg | ORAL_CAPSULE | Freq: Two times a day (BID) | ORAL | Status: DC
  Administered 2024-01-17 – 2024-01-18 (×3): 200 mg via ORAL
  Filled 2024-01-17 (×3): qty 1

## 2024-01-17 MED ORDER — POLYETHYLENE GLYCOL 3350 17 G PO PACK
17.0000 g | PACK | Freq: Two times a day (BID) | ORAL | Status: DC
  Administered 2024-01-17 – 2024-01-18 (×3): 17 g via ORAL
  Filled 2024-01-17 (×3): qty 1

## 2024-01-17 MED ORDER — BUPIVACAINE-EPINEPHRINE (PF) 0.25% -1:200000 IJ SOLN
INTRAMUSCULAR | Status: AC
  Filled 2024-01-17: qty 30

## 2024-01-17 MED ORDER — CEFAZOLIN SODIUM-DEXTROSE 2-4 GM/100ML-% IV SOLN
2.0000 g | INTRAVENOUS | Status: AC
  Administered 2024-01-17: 07:00:00 2 g via INTRAVENOUS
  Filled 2024-01-17: qty 100

## 2024-01-17 MED ORDER — ASPIRIN 81 MG PO CHEW
81.0000 mg | CHEWABLE_TABLET | Freq: Two times a day (BID) | ORAL | Status: DC
  Administered 2024-01-17 – 2024-01-18 (×2): 81 mg via ORAL
  Filled 2024-01-17 (×2): qty 1

## 2024-01-17 MED ORDER — PROPOFOL 10 MG/ML IV BOLUS
INTRAVENOUS | Status: AC
  Filled 2024-01-17: qty 20

## 2024-01-17 MED ORDER — LATANOPROST 0.005 % OP SOLN
1.0000 [drp] | Freq: Every day | OPHTHALMIC | Status: DC
  Administered 2024-01-17: 21:00:00 1 [drp] via OPHTHALMIC
  Filled 2024-01-17: qty 2.5

## 2024-01-17 MED ORDER — ONDANSETRON HCL 4 MG PO TABS: 4.0000 mg | ORAL_TABLET | Freq: Four times a day (QID) | ORAL | Status: DC | PRN

## 2024-01-17 MED ORDER — METHOCARBAMOL 500 MG PO TABS
500.0000 mg | ORAL_TABLET | Freq: Four times a day (QID) | ORAL | Status: DC | PRN
  Administered 2024-01-17 – 2024-01-18 (×3): 500 mg via ORAL
  Filled 2024-01-17 (×3): qty 1

## 2024-01-17 MED ORDER — LACTATED RINGERS IV SOLN: INTRAVENOUS | Status: DC

## 2024-01-17 MED ORDER — METOCLOPRAMIDE HCL 5 MG/ML IJ SOLN: 5.0000 mg | Freq: Three times a day (TID) | INTRAMUSCULAR | Status: DC | PRN

## 2024-01-17 MED ORDER — ALUM & MAG HYDROXIDE-SIMETH 200-200-20 MG/5ML PO SUSP: 30.0000 mL | ORAL | Status: DC | PRN

## 2024-01-17 MED ORDER — OXYCODONE HCL 5 MG PO TABS
10.0000 mg | ORAL_TABLET | ORAL | Status: DC | PRN
  Administered 2024-01-17: 15:00:00 10 mg via ORAL

## 2024-01-17 MED ORDER — ACETAMINOPHEN 10 MG/ML IV SOLN: 1000.0000 mg | Freq: Once | INTRAVENOUS | Status: DC | PRN

## 2024-01-17 MED ORDER — ORAL CARE MOUTH RINSE: 15.0000 mL | Freq: Once | OROMUCOSAL | Status: AC

## 2024-01-17 MED ORDER — SODIUM CHLORIDE 0.9 % IR SOLN
Status: DC | PRN
  Administered 2024-01-17: 08:00:00 1000 mL

## 2024-01-17 MED ORDER — SODIUM CHLORIDE 0.9 % IV SOLN: INTRAVENOUS | Status: DC

## 2024-01-17 MED ORDER — OXYCODONE HCL 5 MG/5ML PO SOLN: 5.0000 mg | Freq: Once | ORAL | Status: DC | PRN

## 2024-01-17 MED ORDER — DIPHENHYDRAMINE HCL 12.5 MG/5ML PO ELIX: 12.5000 mg | ORAL_SOLUTION | ORAL | Status: DC | PRN

## 2024-01-17 MED ORDER — DEXAMETHASONE SOD PHOSPHATE PF 10 MG/ML IJ SOLN
10.0000 mg | Freq: Once | INTRAMUSCULAR | Status: AC
  Administered 2024-01-18: 10 mg via INTRAVENOUS

## 2024-01-17 MED ORDER — MENTHOL 3 MG MT LOZG: 1.0000 | LOZENGE | OROMUCOSAL | Status: DC | PRN

## 2024-01-17 MED ORDER — METOCLOPRAMIDE HCL 5 MG PO TABS: 5.0000 mg | ORAL_TABLET | Freq: Three times a day (TID) | ORAL | Status: DC | PRN

## 2024-01-17 MED ORDER — 0.9 % SODIUM CHLORIDE (POUR BTL) OPTIME
TOPICAL | Status: DC | PRN
  Administered 2024-01-17: 08:00:00 1000 mL

## 2024-01-17 MED ORDER — PROPOFOL 500 MG/50ML IV EMUL
INTRAVENOUS | Status: DC | PRN
  Administered 2024-01-17: 07:00:00 100 ug/kg/min via INTRAVENOUS

## 2024-01-17 MED ORDER — PROPOFOL 1000 MG/100ML IV EMUL
INTRAVENOUS | Status: AC
  Filled 2024-01-17: qty 100

## 2024-01-17 MED ORDER — DEXAMETHASONE SOD PHOSPHATE PF 10 MG/ML IJ SOLN
8.0000 mg | Freq: Once | INTRAMUSCULAR | Status: AC
  Administered 2024-01-17: 07:00:00 8 mg via INTRAVENOUS

## 2024-01-17 MED ORDER — HYDROMORPHONE HCL 1 MG/ML IJ SOLN: 0.5000 mg | INTRAMUSCULAR | Status: DC | PRN

## 2024-01-17 MED ORDER — SODIUM CHLORIDE (PF) 0.9 % IJ SOLN
INTRAMUSCULAR | Status: AC
  Filled 2024-01-17: qty 30

## 2024-01-17 MED ORDER — MEPERIDINE HCL 25 MG/ML IJ SOLN: 6.2500 mg | INTRAMUSCULAR | Status: DC | PRN

## 2024-01-17 MED ORDER — HYDROMORPHONE HCL 1 MG/ML IJ SOLN: 0.2500 mg | INTRAMUSCULAR | Status: DC | PRN

## 2024-01-17 MED ORDER — BISACODYL 10 MG RE SUPP: 10.0000 mg | Freq: Every day | RECTAL | Status: DC | PRN

## 2024-01-17 MED ORDER — SODIUM CHLORIDE (PF) 0.9 % IJ SOLN
INTRAMUSCULAR | Status: DC | PRN
  Administered 2024-01-17: 08:00:00 30 mL

## 2024-01-17 MED ORDER — ACETAMINOPHEN 500 MG PO TABS
1000.0000 mg | ORAL_TABLET | Freq: Four times a day (QID) | ORAL | Status: DC
  Administered 2024-01-17 – 2024-01-18 (×4): 1000 mg via ORAL
  Filled 2024-01-17 (×5): qty 2

## 2024-01-17 MED ORDER — DORZOLAMIDE HCL-TIMOLOL MAL 2-0.5 % OP SOLN
1.0000 [drp] | Freq: Two times a day (BID) | OPHTHALMIC | Status: DC
  Administered 2024-01-17 – 2024-01-18 (×2): 1 [drp] via OPHTHALMIC
  Filled 2024-01-17: qty 10

## 2024-01-17 MED ORDER — SENNA 8.6 MG PO TABS
2.0000 | ORAL_TABLET | Freq: Every day | ORAL | Status: DC
  Administered 2024-01-17: 21:00:00 17.2 mg via ORAL
  Filled 2024-01-17: qty 2

## 2024-01-17 MED ORDER — OXYCODONE HCL 5 MG PO TABS
5.0000 mg | ORAL_TABLET | ORAL | Status: DC | PRN
  Administered 2024-01-17 – 2024-01-18 (×3): 10 mg via ORAL
  Filled 2024-01-17 (×4): qty 2

## 2024-01-17 MED ORDER — METHOCARBAMOL 1000 MG/10ML IJ SOLN: 500.0000 mg | Freq: Four times a day (QID) | INTRAMUSCULAR | Status: DC | PRN

## 2024-01-17 MED ORDER — CEFAZOLIN SODIUM-DEXTROSE 2-4 GM/100ML-% IV SOLN
2.0000 g | Freq: Four times a day (QID) | INTRAVENOUS | Status: AC
  Administered 2024-01-17 (×2): 2 g via INTRAVENOUS
  Filled 2024-01-17 (×2): qty 100

## 2024-01-17 MED ORDER — LORAZEPAM 0.5 MG PO TABS: 0.5000 mg | ORAL_TABLET | Freq: Every evening | ORAL | Status: DC | PRN

## 2024-01-17 MED ORDER — PROPOFOL 10 MG/ML IV BOLUS
INTRAVENOUS | Status: DC | PRN
  Administered 2024-01-17 (×2): 50 mg via INTRAVENOUS

## 2024-01-17 MED ORDER — LIDOCAINE HCL (PF) 2 % IJ SOLN
INTRAMUSCULAR | Status: AC
  Filled 2024-01-17: qty 5

## 2024-01-17 MED ORDER — DROPERIDOL 2.5 MG/ML IJ SOLN: 0.6250 mg | Freq: Once | INTRAMUSCULAR | Status: DC | PRN

## 2024-01-17 MED ORDER — BUPIVACAINE-EPINEPHRINE (PF) 0.25% -1:200000 IJ SOLN
INTRAMUSCULAR | Status: DC | PRN
  Administered 2024-01-17: 08:00:00 30 mL

## 2024-01-17 MED ORDER — ONDANSETRON HCL 4 MG/2ML IJ SOLN: 4.0000 mg | Freq: Four times a day (QID) | INTRAMUSCULAR | Status: DC | PRN

## 2024-01-17 MED ORDER — TRANEXAMIC ACID-NACL 1000-0.7 MG/100ML-% IV SOLN
1000.0000 mg | Freq: Once | INTRAVENOUS | Status: AC
  Administered 2024-01-17: 13:00:00 1000 mg via INTRAVENOUS
  Filled 2024-01-17: qty 100

## 2024-01-17 MED ORDER — MIDAZOLAM HCL 5 MG/5ML IJ SOLN
INTRAMUSCULAR | Status: DC | PRN
  Administered 2024-01-17: 07:00:00 2 mg via INTRAVENOUS

## 2024-01-17 MED ORDER — ONDANSETRON HCL 4 MG/2ML IJ SOLN
INTRAMUSCULAR | Status: DC | PRN
  Administered 2024-01-17: 07:00:00 4 mg via INTRAVENOUS

## 2024-01-17 MED FILL — Fentanyl Citrate Preservative Free (PF) Inj 100 MCG/2ML: INTRAMUSCULAR | Qty: 2 | Status: AC

## 2024-01-17 SURGICAL SUPPLY — 47 items
ATTUNE MED ANAT PAT 32 KNEE (Knees) IMPLANT
BAG COUNTER SPONGE SURGICOUNT (BAG) IMPLANT
BAG ZIPLOCK 12X15 (MISCELLANEOUS) ×1 IMPLANT
BASEPLATE TIB CMT FB PCKT SZ5 (Knees) IMPLANT
BLADE SAW SGTL 11.0X1.19X90.0M (BLADE) IMPLANT
BLADE SAW SGTL 13.0X1.19X90.0M (BLADE) ×1 IMPLANT
BNDG ELASTIC 6INX 5YD STR LF (GAUZE/BANDAGES/DRESSINGS) ×1 IMPLANT
BOWL SMART MIX CTS (DISPOSABLE) ×1 IMPLANT
CEMENT HV SMART SET (Cement) ×2 IMPLANT
COMPONENT FEM CMT ATTN NRW 4RT (Joint) IMPLANT
COOLER ICEMAN CLASSIC (MISCELLANEOUS) IMPLANT
COVER SURGICAL LIGHT HANDLE (MISCELLANEOUS) ×1 IMPLANT
DERMABOND ADVANCED .7 DNX12 (GAUZE/BANDAGES/DRESSINGS) ×1 IMPLANT
DRAPE U-SHAPE 47X51 STRL (DRAPES) ×1 IMPLANT
DRESSING AQUACEL AG SP 3.5X10 (GAUZE/BANDAGES/DRESSINGS) ×1 IMPLANT
DRSG AQUACEL AG ADV 3.5X10 (GAUZE/BANDAGES/DRESSINGS) IMPLANT
DURAPREP 26ML APPLICATOR (WOUND CARE) ×2 IMPLANT
ELECT REM PT RETURN 15FT ADLT (MISCELLANEOUS) ×1 IMPLANT
GLOVE BIO SURGEON STRL SZ 6 (GLOVE) ×1 IMPLANT
GLOVE BIOGEL PI IND STRL 6.5 (GLOVE) ×1 IMPLANT
GLOVE BIOGEL PI IND STRL 7.5 (GLOVE) ×1 IMPLANT
GLOVE ORTHO TXT STRL SZ7.5 (GLOVE) ×2 IMPLANT
GOWN STRL REUS W/ TWL LRG LVL3 (GOWN DISPOSABLE) ×2 IMPLANT
HOLDER FOLEY CATH W/STRAP (MISCELLANEOUS) IMPLANT
INSERT MED ATTUNE KNEE 5 7 RT (Insert) IMPLANT
KIT TURNOVER KIT A (KITS) ×1 IMPLANT
MANIFOLD NEPTUNE II (INSTRUMENTS) ×1 IMPLANT
NDL SAFETY ECLIP 18X1.5 (MISCELLANEOUS) IMPLANT
NS IRRIG 1000ML POUR BTL (IV SOLUTION) ×1 IMPLANT
PACK TOTAL KNEE CUSTOM (KITS) ×1 IMPLANT
PAD COLD SHLDR WRAP-ON (PAD) IMPLANT
PENCIL SMOKE EVACUATOR (MISCELLANEOUS) ×1 IMPLANT
PIN FIX SIGMA LCS THRD HI (PIN) IMPLANT
PROTECTOR NERVE ULNAR (MISCELLANEOUS) ×1 IMPLANT
SET HNDPC FAN SPRY TIP SCT (DISPOSABLE) ×1 IMPLANT
SET PAD KNEE POSITIONER (MISCELLANEOUS) ×1 IMPLANT
SPIKE FLUID TRANSFER (MISCELLANEOUS) ×2 IMPLANT
SUT MNCRL AB 4-0 PS2 18 (SUTURE) ×1 IMPLANT
SUT STRATAFIX PDS+ 0 24IN (SUTURE) ×1 IMPLANT
SUT VIC AB 1 CT1 36 (SUTURE) ×1 IMPLANT
SUT VIC AB 2-0 CT1 TAPERPNT 27 (SUTURE) ×2 IMPLANT
SYR 3ML LL SCALE MARK (SYRINGE) IMPLANT
TOWEL GREEN STERILE FF (TOWEL DISPOSABLE) ×1 IMPLANT
TRAY FOLEY MTR SLVR 16FR STAT (SET/KITS/TRAYS/PACK) ×1 IMPLANT
TUBE SUCTION HIGH CAP CLEAR NV (SUCTIONS) ×1 IMPLANT
WATER STERILE IRR 1000ML POUR (IV SOLUTION) ×2 IMPLANT
WRAP KNEE MAXI GEL POST OP (GAUZE/BANDAGES/DRESSINGS) ×1 IMPLANT

## 2024-01-17 NOTE — Anesthesia Postprocedure Evaluation (Signed)
 Anesthesia Post Note  Patient: Isabel Salazar  Procedure(s) Performed: ARTHROPLASTY, KNEE, TOTAL (Right: Knee)     Patient location during evaluation: PACU Anesthesia Type: Spinal Level of consciousness: oriented and awake and alert Pain management: pain level controlled Vital Signs Assessment: post-procedure vital signs reviewed and stable Respiratory status: spontaneous breathing, respiratory function stable and patient connected to nasal cannula oxygen Cardiovascular status: blood pressure returned to baseline and stable Postop Assessment: no headache, no backache and no apparent nausea or vomiting Anesthetic complications: no   No notable events documented.  Last Vitals:  Vitals:   01/17/24 1044 01/17/24 1336  BP: 101/64 105/66  Pulse: 61 70  Resp: 16 18  Temp: 36.6 C (!) 36.4 C  SpO2: 98% 97%    Last Pain:  Vitals:   01/17/24 1103  TempSrc:   PainSc: 0-No pain                 [name]

## 2024-01-17 NOTE — Anesthesia Procedure Notes (Signed)
 Spinal  Start time: 01/17/2024 7:23 AM End time: 01/17/2024 7:26 AM Reason for block: surgical anesthesia  Staffing Performed: anesthesiologist  Authorized by: Tilford Franky BIRCH, MD   Performed by: Tilford Franky BIRCH, MD  Preanesthetic Checklist Completed: patient identified, IV checked, site marked, risks and benefits discussed, surgical consent, monitors and equipment checked, pre-op evaluation and timeout performed Spinal Block Patient position: sitting Prep: DuraPrep and site prepped and draped Location: L3-4 Injection technique: single-shot Needle Needle type: Pencan  Needle gauge: 24 G Needle length: 10 cm Needle insertion depth (cm): 10 Assessment Events: CSF return  Additional Notes Patient tolerated well. No immediate complications.  Functioning IV was confirmed and monitors were applied. Sterile prep and drape, including hand hygiene and sterile gloves were used. The patient was positioned and the back was prepped. The skin was anesthetized with lidocaine . Free flow of clear CSF was obtained prior to injecting local anesthetic into the CSF. The spinal needle aspirated freely following injection. The needle was carefully withdrawn. The patient tolerated the procedure well.

## 2024-01-17 NOTE — Anesthesia Procedure Notes (Signed)
 Anesthesia Regional Block: Adductor canal block   Pre-Anesthetic Checklist: , timeout performed,  Correct Patient, Correct Site, Correct Laterality,  Correct Procedure, Correct Position, site marked,  Risks and benefits discussed,  Surgical consent,  Pre-op evaluation,  At surgeon's request and post-op pain management  Laterality: Right  Prep: chloraprep       Needles:  Injection technique: Single-shot  Needle Type: Echogenic Stimulator Needle     Needle Length: 9cm  Needle Gauge: 21     Additional Needles:   Procedures:,,,, ultrasound used (permanent image in chart),,    Narrative:  Start time: 01/17/2024 7:00 AM End time: 01/17/2024 7:05 AM Injection made incrementally with aspirations every 5 mL.  Performed by: Personally  Anesthesiologist: Tilford Franky BIRCH, MD  Additional Notes: Discussed risks and benefits of the nerve block in detail, including but not limited vascular injury, permanent nerve damage and infection.   Patient tolerated the procedure well. Local anesthetic introduced in an incremental fashion under minimal resistance after negative aspirations. No paresthesias were elicited. After completion of the procedure, no acute issues were identified and patient continued to be monitored by RN.

## 2024-01-17 NOTE — Discharge Instructions (Signed)

## 2024-01-17 NOTE — Op Note (Signed)
 NAME:  Isabel Salazar                      MEDICAL RECORD NO.:  [id number]                             FACILITY:  [facility]      PHYSICIAN:  Donnice BIRCH. Ernie, M.D.  DATE OF BIRTH:  03/27/1955      DATE OF PROCEDURE:  01/17/2024                                     OPERATIVE REPORT         PREOPERATIVE DIAGNOSIS:  Right knee osteoarthritis.      POSTOPERATIVE DIAGNOSIS:  Right knee osteoarthritis.      FINDINGS:  The patient was noted to have complete loss of cartilage and   bone-on-bone arthritis with associated osteophytes in the medial and patellofemoral compartments of   the knee with a significant synovitis and associated effusion.  The patient had failed months of conservative treatment including medications, injection therapy, activity modification.     PROCEDURE:  Right total knee replacement.      COMPONENTS USED:  DePuy Attune FB CR MS knee   system, a size 4N femur, 5 tibia, size 7 mm CR MS AOX insert, and 32 anatomic patellar   button.      SURGEON:  Donnice BIRCH. Ernie, M.D.      ASSISTANT:  Rosina Calin, PA-C.      ANESTHESIA:  Regional and Spinal.      SPECIMENS:  None.      COMPLICATION:  None.      DRAINS:  None.  EBL: <200 cc      TOURNIQUET TIME:  No tourniquet used     The patient was stable to the recovery room.      INDICATION FOR PROCEDURE:  Isabel Salazar is a 68 y.o. female patient of   mine.  The patient had been seen, evaluated, and treated for months conservatively in the   office with medication, activity modification, and injections.  The patient had   radiographic changes of bone-on-bone arthritis with endplate sclerosis and osteophytes noted.  Based on the radiographic changes and failed conservative measures, the patient   decided to proceed with definitive treatment, total knee replacement.  Risks of infection, DVT, component failure, need for revision surgery, neurovascular injury were reviewed in the office setting.  The postop course was  reviewed stressing the efforts to maximize post-operative satisfaction and function.  Consent was obtained for benefit of pain   relief.      PROCEDURE IN DETAIL:  The patient was brought to the operative theater.   Once adequate anesthesia, preoperative antibiotics, 2 gm of Ancef ,1 gm of Tranexamic Acid , and 10 mg of Decadron  administered, the patient was positioned supine with bony prominences protected and padded.  The  right lower extremity was prepped and draped in sterile fashion.  A time-   out was performed identifying the patient, planned procedure, and the appropriate extremity.      The right lower extremity was placed in the [facility] leg holder.  A midline incision was   made followed by median parapatellar arthrotomy.  Following initial   exposure, attention was first directed to the patella.  Precut   measurement was noted to be  24 mm.  I resected down to 13 mm and used a   32 anatomic patellar button to restore patellar height as well as cover the cut surface.      The lug holes were drilled and a metal shim was placed to protect the   patella from retractors and saw blade during the procedure.      At this point, attention was now directed to the femur.  The femoral   canal was opened with a drill, irrigated to try to prevent fat emboli.  An   intramedullary rod was passed at 3 degrees valgus, 8 mm of bone was   resected off the distal femur.  Following this resection, the tibia was   subluxated anteriorly.  Using the extramedullary guide, 2 mm of bone was resected off   the proximal medial tibia.  We confirmed the gap would be   stable medially and laterally with a size 5 spacer block as well as confirmed that the tibial cut was perpendicular in the coronal plane, checking with an alignment rod.      Once this was done, I sized the femur to be a size 4 in the anterior-   posterior dimension, chose a narrow component based on medial and   lateral dimension.  The size 4  rotation block was then pinned in   position anterior referenced using the C-clamp to set rotation.  The   anterior, posterior, and  chamfer cuts were made without difficulty nor   notching making certain that I was along the anterior cortex to help   with flexion gap stability.      The final box cut was made off the lateral aspect of distal femur.      At this point, the tibia was sized to be a size 5.  The size 5 tray was   then pinned in position through the medial third of the tubercle,   drilled, and keel punched.  Trial reduction was now carried with a 4 femur,  5 tibia, a size 7 mm CR MS insert, and the 32 anatomic patella botton.  The knee was brought to full extension with good flexion stability with the patella   tracking through the trochlea without application of pressure.  Given   all these findings the trial components removed.  Final components were   opened and cement was mixed.  The knee was irrigated with normal saline solution and pulse lavage.  The synovial lining was   then injected with 30 cc of 0.25% Marcaine  with epinephrine , 1 cc of Toradol  and 30 cc of NS for a total of 61 cc.     Final implants were then cemented onto cleaned and dried cut surfaces of bone with the knee brought to extension with a size 7 mm CR trial insert.      Once the cement had fully cured, excess cement was removed   throughout the knee.  I confirmed that I was satisfied with the range of   motion and stability, and the final size 7 mm CR MS AOX insert was chosen.  It was   placed into the knee.   No significant   hemostasis was required.  The extensor mechanism was then reapproximated using #1 Vicryl and #1 Stratafix sutures with the knee   in flexion.  The   remaining wound was closed with 2-0 Vicryl and running 4-0 Monocryl.   The knee was cleaned, dried, dressed sterilely using Dermabond and  Aquacel dressing.  The patient was then   brought to recovery room in stable condition,  tolerating the procedure   well.   Please note that Physician Assistant, Rosina Calin, PA-C was present for the entirety of the case, and was utilized for pre-operative positioning, peri-operative retractor management, general facilitation of the procedure and for primary wound closure at the end of the case.              Donnice CORDOBA Ernie, M.D.    01/17/2024 7:27 AM

## 2024-01-17 NOTE — Evaluation (Signed)
 Physical Therapy Evaluation Patient Details Name: Isabel Salazar MRN: [id number] DOB: 18-Nov-1955 Today's Date: 01/17/2024  History of Present Illness  Pt s/p R TKR and with hx of bil THR  Clinical Impression  Pt s/p R TKR and presents with decreased R LE strength/ROM and post op pain limiting functional mobility.  Pt should progress well to dc home with family assist and reports first OP PT scheduled for 01/22/24.        If plan is discharge home, recommend the following: A little help with walking and/or transfers;A little help with bathing/dressing/bathroom;Assistance with cooking/housework;Assist for transportation;Help with stairs or ramp for entrance   Can travel by private vehicle        Equipment Recommendations None recommended by PT  Recommendations for Other Services       Functional Status Assessment Patient has had a recent decline in their functional status and demonstrates the ability to make significant improvements in function in a reasonable and predictable amount of time.     Precautions / Restrictions Precautions Precautions: Fall Restrictions Weight Bearing Restrictions Per Provider Order: Yes RLE Weight Bearing Per Provider Order: Weight bearing as tolerated      Mobility  Bed Mobility Overal bed mobility: Needs Assistance Bed Mobility: Supine to Sit     Supine to sit: Min assist     General bed mobility comments: cues for use of L LE to self assist    Transfers Overall transfer level: Needs assistance Equipment used: Rolling walker (2 wheels) Transfers: Sit to/from Stand Sit to Stand: Min assist           General transfer comment: cues for LE management and use of UEs to self assist    Ambulation/Gait Ambulation/Gait assistance: Min assist Gait Distance (Feet): 29 Feet Assistive device: Rolling walker (2 wheels) Gait Pattern/deviations: Step-to pattern, Decreased step length - right, Decreased step length - left, Shuffle Gait  velocity: decr     General Gait Details: cues for sequence, posture and position from RW; distance ltd by presence of residual nerve block and intermittent mild buckling at R knee  Stairs            Wheelchair Mobility     Tilt Bed    Modified Rankin (Stroke Patients Only)       Balance Overall balance assessment: Needs assistance Sitting-balance support: No upper extremity supported, Feet supported Sitting balance-Leahy Scale: Good     Standing balance support: Bilateral upper extremity supported Standing balance-Leahy Scale: Poor                               Pertinent Vitals/Pain Pain Assessment Pain Assessment: 0-10 Pain Score: 5  Pain Location: R knee Pain Descriptors / Indicators: Aching, Sore Pain Intervention(s): Limited activity within patient's tolerance, Monitored during session, Premedicated before session, Ice applied    Home Living Family/patient expects to be discharged to:: Private residence Living Arrangements: Spouse/significant other Available Help at Discharge: Family;Available 24 hours/day Type of Home: House Home Access: Stairs to enter Entrance Stairs-Rails: Right;Left;Can reach both Entrance Stairs-Number of Steps: 3   Home Layout: One level Home Equipment: [employer] (2 wheels);Cane - single point;Toilet riser      Prior Function Prior Level of Function : Independent/Modified Independent                     Extremity/Trunk Assessment   Upper Extremity Assessment Upper Extremity Assessment: Overall WFL for  tasks assessed    Lower Extremity Assessment Lower Extremity Assessment: RLE deficits/detail    Cervical / Trunk Assessment Cervical / Trunk Assessment: Normal  Communication   Communication Communication: No apparent difficulties    Cognition Arousal: Alert Behavior During Therapy: WFL for tasks assessed/performed   PT - Cognitive impairments: No apparent impairments                          Following commands: Intact       Cueing Cueing Techniques: Verbal cues     General Comments      Exercises Total Joint Exercises Ankle Circles/Pumps: AROM, Both, 10 reps, Supine   Assessment/Plan    PT Assessment Patient needs continued PT services  PT Problem List Decreased strength;Decreased range of motion;Decreased activity tolerance;Decreased balance;Decreased mobility;Decreased knowledge of use of DME;Pain       PT Treatment Interventions DME instruction;Gait training;Stair training;Functional mobility training;Therapeutic exercise;Therapeutic activities;Patient/family education    PT Goals (Current goals can be found in the Care Plan section)  Acute Rehab PT Goals Patient Stated Goal: Regain IND PT Goal Formulation: With patient Time For Goal Achievement: 01/24/24 Potential to Achieve Goals: Good    Frequency 7X/week     Co-evaluation               AM-PAC PT 6 Clicks Mobility  Outcome Measure Help needed turning from your back to your side while in a flat bed without using bedrails?: A Little Help needed moving from lying on your back to sitting on the side of a flat bed without using bedrails?: A Little Help needed moving to and from a bed to a chair (including a wheelchair)?: A Little Help needed standing up from a chair using your arms (e.g., wheelchair or bedside chair)?: A Little Help needed to walk in hospital room?: A Little Help needed climbing 3-5 steps with a railing? : A Lot 6 Click Score: 17    End of Session Equipment Utilized During Treatment: Gait belt Activity Tolerance: Patient tolerated treatment well Patient left: in chair;with call bell/phone within reach;with family/visitor present Nurse Communication: Mobility status PT Visit Diagnosis: Difficulty in walking, not elsewhere classified (R26.2)    Time: 8472-8452 PT Time Calculation (min) (ACUTE ONLY): 20 min   Charges:   PT Evaluation $PT Eval Low Complexity: 1  Low   PT General Charges $$ ACUTE PT VISIT: 1 Visit         [facility] PT Acute Rehabilitation Services Office [phone]   Donnavin Vandenbrink 01/17/2024, 4:00 PM

## 2024-01-17 NOTE — Interval H&P Note (Signed)
 History and Physical Interval Note:  01/17/2024 7:05 AM  Isabel Salazar  has presented today for surgery, with the diagnosis of Right knee osteoarthritis.  The various methods of treatment have been discussed with the patient and family. After consideration of risks, benefits and other options for treatment, the patient has consented to  Procedures: ARTHROPLASTY, KNEE, TOTAL (Right) as a surgical intervention.  The patient's history has been reviewed, patient examined, no change in status, stable for surgery.  I have reviewed the patient's chart and labs.  Questions were answered to the patient's satisfaction.     [name]

## 2024-01-17 NOTE — Transfer of Care (Signed)
 Immediate Anesthesia Transfer of Care Note  Patient: Lauralei S Fedele  Procedure(s) Performed: ARTHROPLASTY, KNEE, TOTAL (Right: Knee)  Patient Location: PACU  Anesthesia Type:Spinal  Level of Consciousness: sedated  Airway & Oxygen Therapy: Patient Spontanous Breathing and Patient connected to face mask oxygen  Post-op Assessment: Report given to RN and Post -op Vital signs reviewed and stable  Post vital signs: Reviewed and stable  Last Vitals:  Vitals Value Taken Time  BP 89/67 01/17/24 08:54  Temp    Pulse 73 01/17/24 08:54  Resp 17 01/17/24 08:54  SpO2 98 % 01/17/24 08:54  Vitals shown include unfiled device data.  Last Pain:  Vitals:   01/17/24 0612  TempSrc: Oral  PainSc: 0-No pain         Complications: No notable events documented.

## 2024-01-18 ENCOUNTER — Other Ambulatory Visit (HOSPITAL_COMMUNITY): Payer: Self-pay

## 2024-01-18 ENCOUNTER — Encounter (HOSPITAL_COMMUNITY): Payer: Self-pay | Admitting: Orthopedic Surgery

## 2024-01-18 DIAGNOSIS — M1711 Unilateral primary osteoarthritis, right knee: Secondary | ICD-10-CM | POA: Diagnosis not present

## 2024-01-18 LAB — CBC
HCT: 33.7 % — ABNORMAL LOW (ref 36.0–46.0)
Hemoglobin: 11.5 g/dL — ABNORMAL LOW (ref 12.0–15.0)
MCH: 30 pg (ref 26.0–34.0)
MCHC: 34.1 g/dL (ref 30.0–36.0)
MCV: 88 fL (ref 80.0–100.0)
Platelets: 197 K/uL (ref 150–400)
RBC: 3.83 MIL/uL — ABNORMAL LOW (ref 3.87–5.11)
RDW: 13.2 % (ref 11.5–15.5)
WBC: 8 K/uL (ref 4.0–10.5)
nRBC: 0 % (ref 0.0–0.2)

## 2024-01-18 LAB — BASIC METABOLIC PANEL WITH GFR
Anion gap: 7 (ref 5–15)
BUN: 13 mg/dL (ref 8–23)
CO2: 25 mmol/L (ref 22–32)
Calcium: 8.5 mg/dL — ABNORMAL LOW (ref 8.9–10.3)
Chloride: 110 mmol/L (ref 98–111)
Creatinine, Ser: 0.74 mg/dL (ref 0.44–1.00)
GFR, Estimated: >60 mL/min
Glucose, Bld: 114 mg/dL — ABNORMAL HIGH (ref 70–99)
Potassium: 4.2 mmol/L (ref 3.5–5.1)
Sodium: 142 mmol/L (ref 135–145)

## 2024-01-18 MED ORDER — METHOCARBAMOL 500 MG PO TABS
500.0000 mg | ORAL_TABLET | Freq: Four times a day (QID) | ORAL | 0 refills | Status: AC | PRN
  Filled 2024-01-18: qty 40, 10d supply, fill #0

## 2024-01-18 MED ORDER — OXYCODONE HCL 5 MG PO TABS
5.0000 mg | ORAL_TABLET | ORAL | 0 refills | Status: AC | PRN
  Filled 2024-01-18: qty 42, 7d supply, fill #0

## 2024-01-18 MED ORDER — SENNA 8.6 MG PO TABS
2.0000 | ORAL_TABLET | Freq: Every day | ORAL | 0 refills | Status: AC
  Filled 2024-01-18: qty 28, 14d supply, fill #0

## 2024-01-18 MED ORDER — CELECOXIB 200 MG PO CAPS
200.0000 mg | ORAL_CAPSULE | Freq: Two times a day (BID) | ORAL | 0 refills | Status: AC
  Filled 2024-01-18: qty 60, 30d supply, fill #0

## 2024-01-18 MED ORDER — ACETAMINOPHEN 500 MG PO TABS: 1000.0000 mg | ORAL_TABLET | Freq: Four times a day (QID) | ORAL | Status: AC

## 2024-01-18 MED ORDER — ASPIRIN 81 MG PO CHEW
81.0000 mg | CHEWABLE_TABLET | Freq: Two times a day (BID) | ORAL | 0 refills | Status: AC
  Filled 2024-01-18: qty 56, 28d supply, fill #0

## 2024-01-18 MED ORDER — POLYETHYLENE GLYCOL 3350 17 GM/SCOOP PO POWD
17.0000 g | Freq: Two times a day (BID) | ORAL | 0 refills | Status: AC
  Filled 2024-01-18: qty 238, 7d supply, fill #0

## 2024-01-18 NOTE — Progress Notes (Signed)
 Orthopedic Tech Progress Note Patient Details:  Isabel Salazar 09/26/1955 [id number]  Ortho Devices Type of Ortho Device: Knee Immobilizer Ortho Device/Splint Location: RLE Ortho Device/Splint Interventions: Application   Post Interventions Patient Tolerated: Well  Massie BRAVO [name] 01/18/2024, 9:28 AM

## 2024-01-18 NOTE — Care Management Obs Status (Signed)
 MEDICARE OBSERVATION STATUS NOTIFICATION   Patient Details  Name: Isabel Salazar MRN: [id number] Date of Birth: December 06, 1955   Medicare Observation Status Notification Given:  Yes    Alfonse JONELLE Rex, RN 01/18/2024, 10:14 AM

## 2024-01-18 NOTE — Progress Notes (Signed)
 Physical Therapy Treatment Patient Details Name: Isabel Salazar MRN: [id number] DOB: [birth year] Today's Date: 01/18/2024   History of Present Illness Pt s/p R TKR and with hx of bil THR    PT Comments  Pt very motivated but with continued limited quad activation (residual nerve block?) and KI ordered to offer support with gait for safety.  Pt performed HEP with assist, up to ambulate limited distance in hall and up to bathroom for toileting.  Will follow up with stair training prior to dc this date.    If plan is discharge home, recommend the following: A little help with walking and/or transfers;A little help with bathing/dressing/bathroom;Assistance with cooking/housework;Assist for transportation;Help with stairs or ramp for entrance   Can travel by private vehicle        Equipment Recommendations  None recommended by PT    Recommendations for Other Services       Precautions / Restrictions Precautions Precautions: Fall Required Braces or Orthoses: Knee Immobilizer - Right Knee Immobilizer - Right: Discontinue once straight leg raise with < 10 degree lag Restrictions Weight Bearing Restrictions Per Provider Order: Yes RLE Weight Bearing Per Provider Order: Weight bearing as tolerated     Mobility  Bed Mobility Overal bed mobility: Needs Assistance Bed Mobility: Supine to Sit     Supine to sit: Contact guard     General bed mobility comments: CGA for safety    Transfers Overall transfer level: Needs assistance Equipment used: Rolling walker (2 wheels) Transfers: Sit to/from Stand Sit to Stand: Contact guard assist           General transfer comment: Min Steady assist with cues for LE management and use of UEs to self assist    Ambulation/Gait Ambulation/Gait assistance: Contact guard assist Gait Distance (Feet): 75 Feet Assistive device: Rolling walker (2 wheels) Gait Pattern/deviations: Step-to pattern, Decreased step length - right, Decreased step  length - left, Shuffle Gait velocity: decr     General Gait Details: cues for sequence, posture and position from [employer]             Wheelchair Mobility     Tilt Bed    Modified Rankin (Stroke Patients Only)       Balance Overall balance assessment: Needs assistance Sitting-balance support: No upper extremity supported, Feet supported Sitting balance-Leahy Scale: Good     Standing balance support: Single extremity supported Standing balance-Leahy Scale: Poor                              Communication Communication Communication: No apparent difficulties  Cognition Arousal: Alert Behavior During Therapy: WFL for tasks assessed/performed   PT - Cognitive impairments: No apparent impairments                         Following commands: Intact      Cueing Cueing Techniques: Verbal cues  Exercises Total Joint Exercises Ankle Circles/Pumps: AROM, Both, 10 reps, Supine Quad Sets: AROM, Both, 10 reps, Supine Short Arc Quad: AAROM, Right, 10 reps, Supine Heel Slides: AAROM, Right, 15 reps, Supine Hip ABduction/ADduction: AAROM, Right, 10 reps, Supine Straight Leg Raises: AAROM, Right, 10 reps, Supine    General Comments        Pertinent Vitals/Pain Pain Assessment Pain Assessment: 0-10 Pain Score: 4  Pain Location: R knee Pain Descriptors / Indicators: Aching, Sore Pain Intervention(s): Limited activity within patient's tolerance, Monitored during  session, Premedicated before session, Ice applied    Home Living                          Prior Function            PT Goals (current goals can now be found in the care plan section) Acute Rehab PT Goals Patient Stated Goal: Regain IND PT Goal Formulation: With patient Time For Goal Achievement: 01/24/24 Potential to Achieve Goals: Good Progress towards PT goals: Progressing toward goals    Frequency    7X/week      PT Plan      Co-evaluation               AM-PAC PT 6 Clicks Mobility   Outcome Measure  Help needed turning from your back to your side while in a flat bed without using bedrails?: A Little Help needed moving from lying on your back to sitting on the side of a flat bed without using bedrails?: A Little Help needed moving to and from a bed to a chair (including a wheelchair)?: A Little Help needed standing up from a chair using your arms (e.g., wheelchair or bedside chair)?: A Little Help needed to walk in hospital room?: A Little Help needed climbing 3-5 steps with a railing? : A Lot 6 Click Score: 17    End of Session Equipment Utilized During Treatment: Gait belt Activity Tolerance: Patient tolerated treatment well Patient left: with call bell/phone within reach;with family/visitor present;Other (comment) (bathroom) Nurse Communication: Mobility status PT Visit Diagnosis: Difficulty in walking, not elsewhere classified (R26.2)     Time: 9141-9060 PT Time Calculation (min) (ACUTE ONLY): 41 min  Charges:    $Gait Training: 8-22 mins $Therapeutic Exercise: 8-22 mins PT General Charges $$ ACUTE PT VISIT: 1 Visit                     [facility] PT Acute Rehabilitation Services Office [phone]    Bransyn Adami 01/18/2024, 12:15 PM

## 2024-01-18 NOTE — Progress Notes (Signed)
" ° °  Subjective: 1 Day Post-Op Procedures (LRB): ARTHROPLASTY, KNEE, TOTAL (Right) Patient reports pain as mild.   Patient seen in rounds with Dr. Ernie. Patient is well, and has had no acute complaints or problems. No acute events overnight. Foley catheter removed. Patient has not been up with PT yet.  We will start therapy today.   Objective: Vital signs in last 24 hours: Temp:  [97.5 F (36.4 C)-98.2 F (36.8 C)] 97.7 F (36.5 C) (12/19 0518) Pulse Rate:  [55-73] 63 (12/19 0518) Resp:  [12-20] 16 (12/19 0518) BP: (89-106)/(56-67) 106/59 (12/19 0518) SpO2:  [95 %-100 %] 98 % (12/19 0518)  Intake/Output from previous day:  Intake/Output Summary (Last 24 hours) at 01/18/2024 0736 Last data filed at 01/18/2024 0600 Gross per 24 hour  Intake 2929.77 ml  Output 2200 ml  Net 729.77 ml     Intake/Output this shift: No intake/output data recorded.  Labs: Recent Labs    01/18/24 0349  HGB 11.5*   Recent Labs    01/18/24 0349  WBC 8.0  RBC 3.83*  HCT 33.7*  PLT 197   Recent Labs    01/18/24 0349  NA 142  K 4.2  CL 110  CO2 25  BUN 13  CREATININE 0.74  GLUCOSE 114*  CALCIUM  8.5*   No results for input(s): LABPT, INR in the last 72 hours.  Exam: General - Patient is Alert and Oriented Extremity - Neurologically intact Sensation intact distally Intact pulses distally Dorsiflexion/Plantar flexion intact Dressing - dressing C/D/I Motor Function - intact, moving foot and toes well on exam.   Past Medical History:  Diagnosis Date   Arthritis    GERD (gastroesophageal reflux disease)    PONV (postoperative nausea and vomiting)     Assessment/Plan: 1 Day Post-Op Procedures (LRB): ARTHROPLASTY, KNEE, TOTAL (Right) Principal Problem:   S/P total knee arthroplasty, right  Estimated body mass index is 29.29 kg/m as calculated from the following:   Height as of this encounter: 5' 5 (1.651 m).   Weight as of this encounter: 79.8 kg. Advance diet Up  with therapy D/C IV fluids   Patient's anticipated LOS is less than 2 midnights, meeting these requirements: - Younger than 36 - Lives within 1 hour of care - Has a competent adult at home to recover with post-op recover - NO history of  - Chronic pain requiring opiods  - Diabetes  - Coronary Artery Disease  - Heart failure  - Heart attack  - Stroke  - DVT/VTE  - Cardiac arrhythmia  - Respiratory Failure/COPD  - Renal failure  - Anemia  - Advanced Liver disease     DVT Prophylaxis - Aspirin  Weight bearing as tolerated.  Hgb stable at 11.5 this AM  Plan is to go Home after hospital stay. Plan for discharge today following 1-2 sessions of PT as long as they are meeting their goals. Patient is scheduled for OPPT. Follow up in the office in 2 weeks.   Rosina Calin, PA-C Orthopedic Surgery [phone] 01/18/2024, 7:36 AM  "

## 2024-01-18 NOTE — Progress Notes (Signed)
 Physical Therapy Treatment Patient Details Name: Isabel Salazar MRN: [id number] DOB: October 27, 1955 Today's Date: 01/18/2024   History of Present Illness Pt s/p R TKR and with hx of bil THR    PT Comments  Pt continues motivated and progressing well with mobility including up to ambulate increased distance in hall, negotiated stairs and reviewed written HEP.  Pt's dtr present for sessions this am.  Pt eager for dc home this date.    If plan is discharge home, recommend the following: A little help with walking and/or transfers;A little help with bathing/dressing/bathroom;Assistance with cooking/housework;Assist for transportation;Help with stairs or ramp for entrance   Can travel by private vehicle        Equipment Recommendations  None recommended by PT    Recommendations for Other Services       Precautions / Restrictions Precautions Precautions: Fall Required Braces or Orthoses: Knee Immobilizer - Right Knee Immobilizer - Right: Discontinue once straight leg raise with < 10 degree lag Restrictions Weight Bearing Restrictions Per Provider Order: Yes RLE Weight Bearing Per Provider Order: Weight bearing as tolerated     Mobility  Bed Mobility Overal bed mobility: Needs Assistance Bed Mobility: Supine to Sit     Supine to sit: Contact guard     General bed mobility comments: Up in chair and returns to same    Transfers Overall transfer level: Needs assistance Equipment used: Rolling walker (2 wheels) Transfers: Sit to/from Stand Sit to Stand: Contact guard assist, Supervision           General transfer comment: Min Steady assist with cues for LE management and use of UEs to self assist    Ambulation/Gait Ambulation/Gait assistance: Contact guard assist, Supervision Gait Distance (Feet): 100 Feet Assistive device: Rolling walker (2 wheels) Gait Pattern/deviations: Step-to pattern, Decreased step length - right, Decreased step length - left, Shuffle Gait  velocity: decr     General Gait Details: cues for sequence, posture and position from RW   Stairs Stairs: Yes Stairs assistance: Min assist Stair Management: Step to pattern, One rail Right, Forwards, With crutches Number of Stairs: 5 General stair comments: 2+3 stairs with rail and crutch; cues for sequence   Wheelchair Mobility     Tilt Bed    Modified Rankin (Stroke Patients Only)       Balance Overall balance assessment: Needs assistance Sitting-balance support: No upper extremity supported, Feet supported Sitting balance-Leahy Scale: Good     Standing balance support: Single extremity supported Standing balance-Leahy Scale: Poor                              Communication Communication Communication: No apparent difficulties  Cognition Arousal: Alert Behavior During Therapy: WFL for tasks assessed/performed   PT - Cognitive impairments: No apparent impairments                         Following commands: Intact      Cueing Cueing Techniques: Verbal cues  Exercises Total Joint Exercises Ankle Circles/Pumps: AROM, Both, 10 reps, Supine Quad Sets: AROM, Both, 10 reps, Supine Short Arc Quad: AAROM, Right, 10 reps, Supine Heel Slides: AAROM, Right, 15 reps, Supine Hip ABduction/ADduction: AAROM, Right, 10 reps, Supine Straight Leg Raises: AAROM, Right, 10 reps, Supine    General Comments        Pertinent Vitals/Pain Pain Assessment Pain Assessment: No/denies pain Pain Score: 4  Pain Location: R knee  Pain Descriptors / Indicators: Aching, Sore Pain Intervention(s): Limited activity within patient's tolerance, Monitored during session, Premedicated before session    Home Living                          Prior Function            PT Goals (current goals can now be found in the care plan section) Acute Rehab PT Goals Patient Stated Goal: Regain IND PT Goal Formulation: With patient Time For Goal Achievement:  01/24/24 Potential to Achieve Goals: Good Progress towards PT goals: Progressing toward goals    Frequency    7X/week      PT Plan      Co-evaluation              AM-PAC PT 6 Clicks Mobility   Outcome Measure  Help needed turning from your back to your side while in a flat bed without using bedrails?: A Little Help needed moving from lying on your back to sitting on the side of a flat bed without using bedrails?: A Little Help needed moving to and from a bed to a chair (including a wheelchair)?: A Little Help needed standing up from a chair using your arms (e.g., wheelchair or bedside chair)?: A Little Help needed to walk in hospital room?: A Little Help needed climbing 3-5 steps with a railing? : A Little 6 Click Score: 18    End of Session Equipment Utilized During Treatment: Gait belt Activity Tolerance: Patient tolerated treatment well Patient left: with call bell/phone within reach;with family/visitor present;Other (comment) Nurse Communication: Mobility status PT Visit Diagnosis: Difficulty in walking, not elsewhere classified (R26.2)     Time: 8981-8954 PT Time Calculation (min) (ACUTE ONLY): 27 min  Charges:    $Gait Training: 8-22 mins $Therapeutic Exercise: 8-22 mins $Therapeutic Activity: 8-22 mins PT General Charges $$ ACUTE PT VISIT: 1 Visit                     [facility] PT Acute Rehabilitation Services Office [phone]    Isabel Salazar 01/18/2024, 12:21 PM

## 2024-01-18 NOTE — TOC Transition Note (Addendum)
 Transition of Care [facility]) - Discharge Note   Patient Details  Name: Isabel Salazar MRN: [id number] Date of Birth: 07-18-1955  Transition of [facility]) CM/SW Contact:  Alfonse JONELLE Rex, RN Phone Number: 01/18/2024, 11:15 AM   Clinical Narrative:   Met with patient at bedside to review dc therapy and home equipment needs, patient confirmed OPPT- EO [place], has RW. MOON completed.  No INPT CM needs     Final next level of care: OP Rehab Barriers to Discharge: No Barriers Identified   Patient Goals and CMS Choice Patient states their goals for this hospitalization and ongoing recovery are:: return home          Discharge Placement                       Discharge Plan and Services Additional resources added to the After Visit Summary for                                       Social Drivers of Health (SDOH) Interventions SDOH Screenings   Food Insecurity: No Food Insecurity (01/17/2024)  Housing: Low Risk (01/17/2024)  Transportation Needs: No Transportation Needs (01/17/2024)  Utilities: Not At Risk (01/17/2024)  Social Connections: Socially Integrated (01/17/2024)  Tobacco Use: Medium Risk (01/17/2024)     Readmission Risk Interventions     No data to display

## 2024-01-18 NOTE — Progress Notes (Signed)
 Discharge meds in a secure bag delivered to patient by this RN

## 2024-01-18 NOTE — Progress Notes (Signed)
 Pt discharged home with daughter in stable condition. DC instructions given. Pt verbalized understanding. TOC meds brought to bedside. No immediate questions or concerns at this time. DC'd from unit via wheelchair.

## 2024-01-18 NOTE — Plan of Care (Signed)
" °  Problem: Education: Goal: Knowledge of General Education information will improve Description: Including pain rating scale, medication(s)/side effects and non-pharmacologic comfort measures Outcome: Completed/Met   Problem: Health Behavior/Discharge Planning: Goal: Ability to manage health-related needs will improve Outcome: Completed/Met   Problem: Clinical Measurements: Goal: Ability to maintain clinical measurements within normal limits will improve Outcome: Completed/Met Goal: Will remain free from infection Outcome: Completed/Met Goal: Diagnostic test results will improve Outcome: Completed/Met Goal: Respiratory complications will improve Outcome: Completed/Met Goal: Cardiovascular complication will be avoided Outcome: Completed/Met   Problem: Activity: Goal: Risk for activity intolerance will decrease Outcome: Completed/Met   Problem: Nutrition: Goal: Adequate nutrition will be maintained Outcome: Completed/Met   Problem: Coping: Goal: Level of anxiety will decrease Outcome: Completed/Met   Problem: Elimination: Goal: Will not experience complications related to bowel motility Outcome: Completed/Met Goal: Will not experience complications related to urinary retention Outcome: Completed/Met   Problem: Pain Managment: Goal: General experience of comfort will improve and/or be controlled Outcome: Completed/Met   Problem: Safety: Goal: Ability to remain free from injury will improve Outcome: Completed/Met   Problem: Skin Integrity: Goal: Risk for impaired skin integrity will decrease Outcome: Completed/Met   Problem: Education: Goal: Knowledge of the prescribed therapeutic regimen will improve Outcome: Completed/Met Goal: Individualized Educational Video(s) Outcome: Completed/Met   Problem: Activity: Goal: Ability to avoid complications of mobility impairment will improve Outcome: Completed/Met Goal: Range of joint motion will improve Outcome:  Completed/Met   Problem: Clinical Measurements: Goal: Postoperative complications will be avoided or minimized Outcome: Completed/Met   Problem: Pain Management: Goal: Pain level will decrease with appropriate interventions Outcome: Completed/Met   Problem: Skin Integrity: Goal: Will show signs of wound healing Outcome: Completed/Met   Problem: Acute Rehab PT Goals(only PT should resolve) Goal: Pt Will Ambulate Outcome: Completed/Met Goal: Pt Will Go Up/Down Stairs Outcome: Completed/Met   "

## 2024-01-27 NOTE — Discharge Summary (Signed)
 Patient ID: Isabel Salazar MRN: [id number] DOB/AGE: January 25, 1956 68 y.o.  Admit date: 01/17/2024 Discharge date: 01/18/2024  Admission Diagnoses:  Right knee osteoarthritis  Discharge Diagnoses:  Principal Problem:   S/P total knee arthroplasty, right   Past Medical History:  Diagnosis Date   Arthritis    GERD (gastroesophageal reflux disease)    PONV (postoperative nausea and vomiting)     Surgeries: Procedures: ARTHROPLASTY, KNEE, TOTAL on 01/17/2024   Consultants:   Discharged Condition: Improved  Hospital Course: PORSCHIA WILLBANKS is an 68 y.o. female who was admitted 01/17/2024 for operative treatment ofS/P total knee arthroplasty, right. Patient has severe unremitting pain that affects sleep, daily activities, and work/hobbies. After pre-op clearance the patient was taken to the operating room on 01/17/2024 and underwent  Procedures: ARTHROPLASTY, KNEE, TOTAL.    Patient was given perioperative antibiotics:  Anti-infectives (From admission, onward)    Start     Dose/Rate Route Frequency Ordered Stop   01/17/24 1400  ceFAZolin  (ANCEF ) IVPB 2g/100 mL premix        2 g 200 mL/hr over 30 Minutes Intravenous Every 6 hours 01/17/24 1039 01/17/24 2136   01/17/24 0600  ceFAZolin  (ANCEF ) IVPB 2g/100 mL premix        2 g 200 mL/hr over 30 Minutes Intravenous On call to O.R. 01/17/24 0541 01/17/24 0727        Patient was given sequential compression devices, early ambulation, and chemoprophylaxis to prevent DVT. Patient worked with PT and was meeting their goals regarding safe ambulation and transfers.  Patient benefited maximally from hospital stay and there were no complications.    Recent vital signs: No data found.   Recent laboratory studies: No results for input(s): WBC, HGB, HCT, PLT, NA, K, CL, CO2, BUN, CREATININE, GLUCOSE, INR, CALCIUM  in the last 72 hours.  Invalid input(s): PT, 2   Discharge Medications:   Allergies as of  01/18/2024   No Known Allergies      Medication List     TAKE these medications    acetaminophen  500 MG tablet Commonly known as: TYLENOL  Take 2 tablets (1,000 mg total) by mouth every 6 (six) hours. What changed: when to take this   Aspirin  Low Dose 81 MG chewable tablet Generic drug: aspirin  Chew 1 tablet (81 mg total) by mouth 2 (two) times daily for 28 days.   celecoxib  200 MG capsule Commonly known as: CELEBREX  Take 1 capsule (200 mg total) by mouth 2 (two) times daily.   dorzolamide -timolol  2-0.5 % ophthalmic solution Commonly known as: COSOPT  Place 1 drop into the right eye 2 (two) times daily.   ESTROVEN PO Take 1 tablet by mouth in the morning.   fluticasone 50 MCG/ACT nasal spray Commonly known as: FLONASE Place 1 spray into both nostrils daily as needed for allergies or rhinitis.   latanoprost  0.005 % ophthalmic solution Commonly known as: XALATAN  Place 1 drop into both eyes at bedtime.   LORazepam  1 MG tablet Commonly known as: ATIVAN  Take 1 mg by mouth at bedtime.   methocarbamol  500 MG tablet Commonly known as: ROBAXIN  Take 1 tablet (500 mg total) by mouth every 6 (six) hours as needed for muscle spasms.   oxyCODONE  5 MG immediate release tablet Commonly known as: Oxy IR/ROXICODONE  Take 1 tablet (5 mg total) by mouth every 4 (four) hours as needed for severe pain (pain score 7-10).   polyethylene glycol powder 17 GM/SCOOP powder Commonly known as: GLYCOLAX /MIRALAX  Dissolve 1 capful (17g) in 4-8 ounces of  liquid and take by mouth 2 times daily.   rosuvastatin  10 MG tablet Commonly known as: CRESTOR  Take 10 mg by mouth at bedtime.   senna 8.6 MG Tabs tablet Commonly known as: SENOKOT Take 2 tablets (17.2 mg total) by mouth at bedtime for 14 days.               Discharge Care Instructions  (From admission, onward)           Start     Ordered   01/18/24 0000  Change dressing       Comments: Maintain surgical dressing until  follow up in the clinic. If the edges start to pull up, may reinforce with tape. If the dressing is no longer working, may remove and cover with gauze and tape, but must keep the area dry and clean.  Call with any questions or concerns.   01/18/24 0738            Diagnostic Studies: No results found.  Disposition: Discharge disposition: 01-Home or Self Care       Discharge Instructions     Call MD / Call 911   Complete by: As directed    If you experience chest pain or shortness of breath, CALL 911 and be transported to the hospital emergency room.  If you develope a fever above 101 F, pus (white drainage) or increased drainage or redness at the wound, or calf pain, call your surgeon's office.   Change dressing   Complete by: As directed    Maintain surgical dressing until follow up in the clinic. If the edges start to pull up, may reinforce with tape. If the dressing is no longer working, may remove and cover with gauze and tape, but must keep the area dry and clean.  Call with any questions or concerns.   Constipation Prevention   Complete by: As directed    Drink plenty of fluids.  Prune juice may be helpful.  You may use a stool softener, such as Colace (over the counter) 100 mg twice a day.  Use MiraLax  (over the counter) for constipation as needed.   Diet - low sodium heart healthy   Complete by: As directed    Increase activity slowly as tolerated   Complete by: As directed    Weight bearing as tolerated with assist device (walker, cane, etc) as directed, use it as long as suggested by your surgeon or therapist, typically at least 4-6 weeks.   Post-operative opioid taper instructions:   Complete by: As directed    POST-OPERATIVE OPIOID TAPER INSTRUCTIONS: It is important to wean off of your opioid medication as soon as possible. If you do not need pain medication after your surgery it is ok to stop day one. Opioids include: Codeine, Hydrocodone(Norco, Vicodin),  Oxycodone (Percocet, oxycontin ) and hydromorphone  amongst others.  Long term and even short term use of opiods can cause: Increased pain response Dependence Constipation Depression Respiratory depression And more.  Withdrawal symptoms can include Flu like symptoms Nausea, vomiting And more Techniques to manage these symptoms Hydrate well Eat regular healthy meals Stay active Use relaxation techniques(deep breathing, meditating, yoga) Do Not substitute Alcohol to help with tapering If you have been on opioids for less than two weeks and do not have pain than it is ok to stop all together.  Plan to wean off of opioids This plan should start within one week post op of your joint replacement. Maintain the same interval or time between taking each  dose and first decrease the dose.  Cut the total daily intake of opioids by one tablet each day Next start to increase the time between doses. The last dose that should be eliminated is the evening dose.      TED hose   Complete by: As directed    Use stockings (TED hose) for 2 weeks on both leg(s).  You may remove them at night for sleeping.        Follow-up Information     Ernie Cough, MD. Schedule an appointment as soon as possible for a visit in 2 week(s).   Specialty: Orthopedic Surgery Contact information: [address] [place] [address] [phone]                  Signed: Rosina JONELLE Calin 01/27/2024, 10:29 AM
